# Patient Record
Sex: Female | Born: 1965 | Race: Black or African American | Hispanic: No | Marital: Single | Smoking: Never smoker
Health system: Southern US, Community
[De-identification: ages and names within clinical notes are randomized; demographics above are authoritative.]

## PROBLEM LIST (undated history)

## (undated) DIAGNOSIS — B029 Zoster without complications: Secondary | ICD-10-CM

## (undated) DIAGNOSIS — Z87442 Personal history of urinary calculi: Secondary | ICD-10-CM

## (undated) DIAGNOSIS — I10 Essential (primary) hypertension: Secondary | ICD-10-CM

## (undated) HISTORY — PX: BREAST SURGERY: SHX581

## (undated) HISTORY — PX: ABDOMINAL HYSTERECTOMY: SHX81

## (undated) HISTORY — DX: Personal history of urinary calculi: Z87.442

## (undated) HISTORY — DX: Zoster without complications: B02.9

## (undated) HISTORY — DX: Essential (primary) hypertension: I10

---

## 2011-07-08 ENCOUNTER — Ambulatory Visit (INDEPENDENT_AMBULATORY_CARE_PROVIDER_SITE_OTHER): Payer: BC Managed Care – PPO | Admitting: Internal Medicine

## 2011-07-08 ENCOUNTER — Encounter: Payer: Self-pay | Admitting: Internal Medicine

## 2011-07-08 VITALS — BP 178/117 | HR 66 | Temp 98.2°F | Resp 14 | Ht 63.0 in | Wt 199.2 lb

## 2011-07-08 DIAGNOSIS — Z87442 Personal history of urinary calculi: Secondary | ICD-10-CM | POA: Insufficient documentation

## 2011-07-08 DIAGNOSIS — Z8619 Personal history of other infectious and parasitic diseases: Secondary | ICD-10-CM | POA: Insufficient documentation

## 2011-07-08 DIAGNOSIS — Z139 Encounter for screening, unspecified: Secondary | ICD-10-CM

## 2011-07-08 DIAGNOSIS — I10 Essential (primary) hypertension: Secondary | ICD-10-CM

## 2011-07-08 DIAGNOSIS — E669 Obesity, unspecified: Secondary | ICD-10-CM

## 2011-07-08 MED ORDER — HYDROCHLOROTHIAZIDE 25 MG PO TABS: 25.0000 mg | ORAL_TABLET | Freq: Every day | ORAL | Status: DC

## 2011-07-08 MED ORDER — TRIAMCINOLONE ACETONIDE 0.1 % EX OINT: TOPICAL_OINTMENT | Freq: Two times a day (BID) | CUTANEOUS | Status: AC

## 2011-07-08 NOTE — Patient Instructions (Addendum)
Return for fasting labs next Friday.  Get your blood pressure checked at [facility] in one week.  Goal 130/80 or less.    I called in triamcinolone ointment  for the itching blister,  Use it twice daily for 1 week at a time.  ------------------------------------------------------------------------------------------------------------------------------------------------------------  Consider the Low Glycemic Index Diet and 6 smaller meals daily .  This boosts your metabolism and regulates your sugars:   7 AM Low carbohydrate Protein  Shakes (EAS Carb Control  Or Atkins ,  Available everywhere,   In  cases at BJs )  2.5 carbs  (Add or substitute a toasted sandwhich thin w/ peanut butter)  10 AM: Protein bar by Atkins (snack size,  Chocolate lover's variety at  BJ's)    Lunch: sandwich on pita bread or flatbread (Joseph's makes a pita bread and a flat bread , available at [employer] and BJ's; Toufayah makes a low carb flatbread available at [employer] and HT) Mission makes a low carb whole wheat tortilla available at [employer] most grocery stores   3 PM:  Mid day :  Another protein bar,  Or a  cheese stick, 1/4 cup of almonds, walnuts, pistachios, pecans, peanuts,  Macadamia nuts  6 PM  Dinner:  "mean and green:"  Meat/chicken/fish, salad, and green veggie : use ranch, vinagrette,  Blue cheese, etc  9 PM snack : Breyer's low carb fudgsicle or  ice cream bar (Carb Smart), or  Weight Watcher's ice cream bar , or another protein shake  Make your substitutions less than 15 grams carbs per serving

## 2011-07-08 NOTE — Progress Notes (Signed)
Patient ID: Victoria Hardin, female   DOB: 01/30/1966, 46 y.o.   MRN: [id number]  Patient Active Problem List  Diagnoses  . Shingles outbreak  . History of kidney stones  . Obesity (BMI 30-39.9)  . Hypertension    Subjective:  CC:   Chief Complaint  Patient presents with  . Establish Care    NP to establish; Fasting for labs.  . Hypertension    Pt has been out of BP medication x1 wk.    HPI:   Victoria Carteris a 46 y.o. female who presents as a new patient.  New onset hypertenson noticed by Dr. Jerilee Field during her obgyn exam in February .  Had been having headaches which resolved once she started the hctz.  History of hysterectomy 2011  secondary to menorrhagia and fibroid uterus  Causing anemia  .  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     Past Medical History  Diagnosis Date  . Shingles outbreak   . History of kidney stones     Past Surgical History  Procedure Date  . Abdominal hysterectomy          The following portions of the patient's history were reviewed and updated as appropriate: Allergies, current medications, and problem list.    Review of Systems:   12 Pt  review of systems was negative except those addressed in the HPI,     History   Social History  . Marital Status: Single    Spouse Name: N/A    Number of Children: N/A  . Years of Education: N/A   Occupational History  . Not on file.   Social History Main Topics  . Smoking status: Never Smoker   . Smokeless tobacco: Not on file  . Alcohol Use: 0.6 oz/week    1 Glasses of wine per week  . Drug Use: Not on file  . Sexually Active: Not on file   Other Topics Concern  . Not on file   Social History Narrative  . No narrative on file    Objective:  BP 178/117  Pulse 66  Temp(Src) 98.2 F (36.8 C) (Oral)  Resp 14  Ht 5\' 3"  (1.6 m)  Wt 199 lb 4 oz (90.379 kg)  BMI 35.30 kg/m2  SpO2 99%  General appearance: alert,  cooperative and appears stated age Ears: normal TM's and external ear canals both ears Throat: lips, mucosa, and tongue normal; teeth and gums normal Neck: no adenopathy, no carotid bruit, supple, symmetrical, trachea midline and thyroid not enlarged, symmetric, no tenderness/mass/nodules Back: symmetric, no curvature. ROM normal. No CVA tenderness. Lungs: clear to auscultation bilaterally Heart: regular rate and rhythm, S1, S2 normal, no murmur, click, rub or gallop Abdomen: soft, non-tender; bowel sounds normal; no masses,  no organomegaly Pulses: 2+ and symmetric Skin: Skin color, texture, turgor normal. No rashes or lesions Lymph nodes: Cervical, supraclavicular, and axillary nodes normal.  Assessment and Plan:  Obesity (BMI 30-39.9) I have addressed  BMI and recommended a low glycemic index diet utilizing smaller more frequent meals to increase metabolism.  I have also recommended that patient start exercising with a goal of 30 minutes of aerobic exercise a minimum of 5 days per week. Screening for lipid disorders, thyroid and diabetes to be done today.     Hypertension hctz  dose increasd to 25 mg .  Return for labs in on eweek    Updated Medication List Outpatient Encounter Prescriptions as of  07/08/2011  Medication Sig Dispense Refill  . hydrochlorothiazide (HYDRODIURIL) 25 MG tablet Take 1 tablet (25 mg total) by mouth daily.  30 tablet  6  . ibuprofen (ADVIL,MOTRIN) 200 MG tablet Take 200 mg by mouth as needed.      . triamcinolone ointment (KENALOG) 0.1 % Apply topically 2 (two) times daily.  30 g  1  . Vitamin D, Ergocalciferol, (DRISDOL) 50000 UNITS CAPS Take 50,000 Units by mouth every 7 (seven) days. X12 weeks.      Marland Kitchen DISCONTD: hydrochlorothiazide (MICROZIDE) 12.5 MG capsule Take 12.5 mg by mouth daily.

## 2011-07-11 NOTE — Assessment & Plan Note (Signed)
I have addressed  BMI and recommended a low glycemic index diet utilizing smaller more frequent meals to increase metabolism.  I have also recommended that patient start exercising with a goal of 30 minutes of aerobic exercise a minimum of 5 days per week. Screening for lipid disorders, thyroid and diabetes to be done today.   

## 2011-07-11 NOTE — Assessment & Plan Note (Signed)
hctz  dose increasd to 25 mg .  Return for labs in on eweek

## 2011-07-15 ENCOUNTER — Telehealth: Payer: Self-pay | Admitting: *Deleted

## 2011-07-15 ENCOUNTER — Other Ambulatory Visit (INDEPENDENT_AMBULATORY_CARE_PROVIDER_SITE_OTHER): Payer: BC Managed Care – PPO | Admitting: *Deleted

## 2011-07-15 ENCOUNTER — Telehealth: Payer: Self-pay | Admitting: Internal Medicine

## 2011-07-15 DIAGNOSIS — R5381 Other malaise: Secondary | ICD-10-CM

## 2011-07-15 DIAGNOSIS — R5383 Other fatigue: Secondary | ICD-10-CM

## 2011-07-15 DIAGNOSIS — Z13220 Encounter for screening for lipoid disorders: Secondary | ICD-10-CM

## 2011-07-15 LAB — COMPREHENSIVE METABOLIC PANEL
AST: 13 U/L (ref 0–37)
Albumin: 4 g/dL (ref 3.5–5.2)
CO2: 31 mEq/L (ref 19–32)
Calcium: 9.3 mg/dL (ref 8.4–10.5)
Creatinine, Ser: 0.8 mg/dL (ref 0.4–1.2)
GFR: 94.76 mL/min (ref >60.00)
Glucose, Bld: 81 mg/dL (ref 70–99)
Potassium: 3.4 mEq/L — ABNORMAL LOW (ref 3.5–5.1)
Sodium: 138 mEq/L (ref 135–145)
Total Protein: 7.6 g/dL (ref 6.0–8.3)

## 2011-07-15 LAB — CBC WITH DIFFERENTIAL/PLATELET
Basophils Relative: 0.6 % (ref 0.0–3.0)
Eosinophils Absolute: 0.1 10*3/uL (ref 0.0–0.7)
Eosinophils Relative: 1.3 % (ref 0.0–5.0)
HCT: 43.1 % (ref 36.0–46.0)
Lymphs Abs: 1.5 10*3/uL (ref 0.7–4.0)
MCHC: 32.4 g/dL (ref 30.0–36.0)
MCV: 86.1 fl (ref 78.0–100.0)
Monocytes Absolute: 0.4 10*3/uL (ref 0.1–1.0)
Neutro Abs: 2.6 10*3/uL (ref 1.4–7.7)
Neutrophils Relative %: 57.2 % (ref 43.0–77.0)
RBC: 5.01 Mil/uL (ref 3.87–5.11)
WBC: 4.6 10*3/uL (ref 4.5–10.5)

## 2011-07-15 LAB — LIPID PANEL
Cholesterol: 200 mg/dL (ref 0–200)
HDL: 57.6 mg/dL (ref >39.00)
Triglycerides: 106 mg/dL (ref 0.0–149.0)

## 2011-07-15 MED ORDER — POTASSIUM CHLORIDE CRYS ER 10 MEQ PO TBCR: 20.0000 meq | EXTENDED_RELEASE_TABLET | Freq: Every day | ORAL | Status: DC

## 2011-07-15 NOTE — Progress Notes (Signed)
Addended by: Duncan Dull on: 07/15/2011 05:40 PM   Modules accepted: Orders

## 2011-07-15 NOTE — Telephone Encounter (Signed)
See result note ,  misrouted when i sent potassium supplement to pharmacy

## 2011-07-15 NOTE — Telephone Encounter (Signed)
Labs ordered.

## 2011-07-15 NOTE — Telephone Encounter (Signed)
Patient came in for labs today. What would you like ordered?

## 2011-07-15 NOTE — Telephone Encounter (Signed)
Cmet, fasting lipids, TSH, and CBC  :  V58.59,  V77.91 fatigue 780.79

## 2011-09-22 ENCOUNTER — Encounter: Payer: Self-pay | Admitting: Internal Medicine

## 2012-03-31 ENCOUNTER — Other Ambulatory Visit: Payer: Self-pay

## 2012-12-20 ENCOUNTER — Other Ambulatory Visit: Payer: Self-pay

## 2013-01-14 ENCOUNTER — Ambulatory Visit (INDEPENDENT_AMBULATORY_CARE_PROVIDER_SITE_OTHER): Payer: BC Managed Care – PPO | Admitting: Internal Medicine

## 2013-01-14 ENCOUNTER — Encounter: Payer: Self-pay | Admitting: Internal Medicine

## 2013-01-14 VITALS — BP 190/110 | HR 62 | Temp 98.2°F | Resp 14 | Ht 63.0 in | Wt 199.5 lb

## 2013-01-14 DIAGNOSIS — R5381 Other malaise: Secondary | ICD-10-CM

## 2013-01-14 DIAGNOSIS — Z23 Encounter for immunization: Secondary | ICD-10-CM

## 2013-01-14 DIAGNOSIS — E669 Obesity, unspecified: Secondary | ICD-10-CM

## 2013-01-14 DIAGNOSIS — E785 Hyperlipidemia, unspecified: Secondary | ICD-10-CM

## 2013-01-14 DIAGNOSIS — I10 Essential (primary) hypertension: Secondary | ICD-10-CM

## 2013-01-14 MED ORDER — AMLODIPINE BESYLATE 10 MG PO TABS: 10.0000 mg | ORAL_TABLET | Freq: Every day | ORAL | Status: DC

## 2013-01-14 NOTE — Progress Notes (Signed)
Patient ID: Victoria Hardin, female   DOB: 11/10/1965, 47 y.o.   MRN: [id number]   Patient Active Problem List   Diagnosis Date Noted  . Obesity (BMI 30-39.9) 07/08/2011  . Hypertension 07/08/2011  . Shingles outbreak   . History of kidney stones     Subjective:  CC:   Chief Complaint  Patient presents with  . Acute Visit    BP Elevated    HPI:   Victoria Carteris a 47 y.o. female who presents for fllow up on hypertension ,. Was seen initially May 2013,  Did not follow up. Has not been taking any anti hypertensive medication for nearly 6 months.  Denies headaches, chest pain ,lower extremity edema and exertional chest pain.    Past Medical History  Diagnosis Date  . Shingles outbreak   . History of kidney stones     Past Surgical History  Procedure Laterality Date  . Abdominal hysterectomy         The following portions of the patient's history were reviewed and updated as appropriate: Allergies, current medications, and problem list.    Review of Systems:   12 Pt  review of systems was negative except those addressed in the HPI,     History   Social History  . Marital Status: Single    Spouse Name: N/A    Number of Children: N/A  . Years of Education: N/A   Occupational History  . Not on file.   Social History Main Topics  . Smoking status: Never Smoker   . Smokeless tobacco: Not on file  . Alcohol Use: 0.6 oz/week    1 Glasses of wine per week  . Drug Use: Not on file  . Sexual Activity: Not on file   Other Topics Concern  . Not on file   Social History Narrative  . No narrative on file    Objective:  Filed Vitals:   01/14/13 1604  BP: 190/110  Pulse:   Temp:   Resp:      General appearance: alert, cooperative and appears stated age Ears: normal TM's and external ear canals both ears Throat: lips, mucosa, and tongue normal; teeth and gums normal Neck: no adenopathy, no carotid bruit, supple, symmetrical, trachea midline and  thyroid not enlarged, symmetric, no tenderness/mass/nodules Back: symmetric, no curvature. ROM normal. No CVA tenderness. Lungs: clear to auscultation bilaterally Heart: regular rate and rhythm, S1, S2 normal, no murmur, click, rub or gallop Abdomen: soft, non-tender; bowel sounds normal; no masses,  no organomegaly Pulses: 2+ and symmetric Skin: Skin color, texture, turgor normal. No rashes or lesions Lymph nodes: Cervical, supraclavicular, and axillary nodes normal.  Assessment and Plan:  Obesity (BMI 30-39.9) I have addressed  BMI and recommended wt loss of 10% of body weight over the next 6 months using a low glycemic index diet and regular exercise a minimum of 5 days per week.    Hypertension Elevated today due to medication lapse. Given prior hypokalemia with use of diuretic,  chanding medication to amlodipine 5 mg daily.  Will change to ARB if she has proteinuria   A total of 25 minutes of face to face time was spent with patient more than half of which was spent in counselling and coordination of care  Updated Medication List Outpatient Encounter Prescriptions as of 01/14/2013  Medication Sig  . ibuprofen (ADVIL,MOTRIN) 200 MG tablet Take 200 mg by mouth as needed.  . Multiple Vitamins-Minerals (MULTIVITAMIN WITH MINERALS) tablet Take 1 tablet by mouth  daily.  . amLODipine (NORVASC) 10 MG tablet Take 1 tablet (10 mg total) by mouth daily.  . [DISCONTINUED] potassium chloride (K-DUR,KLOR-CON) 10 MEQ tablet Take 2 tablets (20 mEq total) by mouth daily.  . [DISCONTINUED] Vitamin D, Ergocalciferol, (DRISDOL) 50000 UNITS CAPS Take 50,000 Units by mouth every 7 (seven) days. X12 weeks.     Orders Placed This Encounter  Procedures  . Tdap vaccine greater than or equal to 7yo IM  . CBC with Differential  . Comprehensive metabolic panel  . TSH  . LDL cholesterol, direct  . Microalbumin / creatinine urine ratio    No Follow-up on file.

## 2013-01-14 NOTE — Patient Instructions (Addendum)
Your blood pressure is very high,  I am resuming medication with amlodipine.  But if your urien test is positive for protein I will be changing your medication to a different type.   Start with 1/2 tablet amlodipine  Daily .  If bp is still > 130/80 after one week,  Increase your dose  to  one   full tablet daily.  If you develop edema ( ankle swelling)  ,  We can prescribe a fluid pill to take as needed.   Baseline labs today ,. fasting labs in 6 months    This is  One version of a  "Low GI"  Diet:  It will still lower your blood sugars and allow you to lose 4 to 8  lbs  per month if you follow it carefully.  Your goal with exercise is a minimum of 30 minutes of aerobic exercise 5 days per week (Walking does not count once it becomes easy!)    All of the foods can be found at grocery stores and in bulk at [employer].  The Atkins protein bars and shakes are available in more varieties at Target, WalMart and [employer] Foods.     7 AM Breakfast:  Choose from the following:  Low carbohydrate Protein  Shakes (I recommend the EAS AdvantEdge "Carb Control" shakes  Or the low carb shakes by Atkins.    2.5 carbs   Arnold's "Sandwhich Thin"toasted  w/ peanut butter (no jelly: about 20 net carbs  "Bagel Thin" with cream cheese and salmon: about 20 carbs   a scrambled egg/bacon/cheese burrito made with Mission's "carb balance" whole wheat tortilla  (about 10 net carbs )   Avoid cereal and bananas, oatmeal and cream of wheat and grits. They are loaded with carbohydrates!   10 AM: high protein snack  Protein bar by Atkins (the snack size, under 200 cal, usually < 6 net carbs).    A stick of cheese:  Around 1 carb,  100 cal     Dannon Light n Fit [place] Yogurt  (80 cal, 8 carbs)  Other so called "protein bars" and Greek yogurts tend to be loaded with carbohydrates.  Remember, in food advertising, the word "energy" is synonymous for " carbohydrate."  Lunch:   A Sandwich using the bread choices listed, Can  use any  Eggs,  lunchmeat, grilled meat or canned tuna), avocado, regular mayo/mustard  and cheese.  A Salad using blue cheese, ranch,  Goddess or vinagrette,  No croutons or "confetti" and no "candied nuts" but regular nuts OK.   No pretzels or chips.  Try the miniature sweet peppers ; they are a good low carb alternative that provide a "crunch" ([employer])  The bread is the only source of carbohydrate in a sandwich and  can be decreased by trying some of these alternatives to traditional loaf bread  Joseph's makes a pita bread and a flat bread that are 50 cal and 4 net carbs available at BJs and WalMart.  This can be toasted to use with hummous as well  Toufayan makes a low carb flatbread that's 100 cal and 9 net carbs available at [employer] and [employer] makes 2 sizes of  Low carb whole wheat tortilla  (The large one is 210 cal and 6 net carbs) Avoid "Low fat dressings, as well as Reyne Dumas and [address] dressings They are loaded with sugar!   3 PM/ Mid day  Snack:  Consider  1 ounce  of  almonds, walnuts, pistachios, pecans, peanuts,  Macadamia nuts or a nut medley.  Avoid "granola"; the dried cranberries and raisins are loaded with carbohydrates. Mixed nuts as long as there are no raisins,  cranberries or dried fruit.     6 PM  Dinner:     Meat/fowl/fish with a green salad, and either broccoli, cauliflower, green beans, spinach, brussel sprouts or  Lima beans. DO NOT BREAD THE PROTEIN!!      There is a low carb pasta by Dreamfield's that is acceptable and tastes great: ( All grocery stores but BJs carry it )  Try Kai Levins Angelo's chicken piccata or chicken or eggplant parm over low carb pasta.(Lowes and BJs)   Clifton Custard Sanchez's "Carnitas" (pulled pork, no sauce,  0 carbs) or his beef pot roast to make a dinner burrito (at BJ's)  Pesto over low carb pasta (bj's sells a good quality pesto in the center refrigerated section of the deli   Whole wheat pasta is still full of digestible  carbs and  Not as low in glycemic index as Dreamfield's.   Brown rice is still rice,  So skip the rice and noodles if you eat [place] or [place] (or at least limit to 1/2 cup)  9 PM snack :   Breyer's "low carb" fudgsicle or  ice cream bar (Carb Smart line), or  Weight Watcher's ice cream bar , or another "no sugar added" ice cream;  a serving of fresh berries/cherries with whipped cream   Cheese or DANNON'S LlGHT [place] FIT [place] YOGURT  Avoid bananas, pineapple, grapes  and watermelon on a regular basis because they are high in sugar.  THINK OF THEM AS DESSERT  Remember that snack Substitutions should be less than 10 NET carbs per serving and meals < 20 carbs. Remember to subtract fiber grams to get the "net carbs."  Hypertension As your heart beats, it forces blood through your arteries. This force is your blood pressure. If the pressure is too high, it is called hypertension (HTN) or high blood pressure. HTN is dangerous because you may have it and not know it. High blood pressure may mean that your heart has to work harder to pump blood. Your arteries may be narrow or stiff. The extra work puts you at risk for heart disease, stroke, and other problems.  Blood pressure consists of two numbers, a higher number over a lower, 110/72, for example. It is stated as "110 over 72." The ideal is below 120 for the top number (systolic) and under 80 for the bottom (diastolic). Write down your blood pressure today. You should pay close attention to your blood pressure if you have certain conditions such as:  Heart failure.  Prior heart attack.  Diabetes  Chronic kidney disease.  Prior stroke.  Multiple risk factors for heart disease. To see if you have HTN, your blood pressure should be measured while you are seated with your arm held at the level of the heart. It should be measured at least twice. A one-time elevated blood pressure reading (especially in the Emergency Department) does not mean that you  need treatment. There may be conditions in which the blood pressure is different between your right and left arms. It is important to see your caregiver soon for a recheck. Most people have essential hypertension which means that there is not a specific cause. This type of high blood pressure may be lowered by changing lifestyle factors such as:  Stress.  Smoking.  Lack  of exercise.  Excessive weight.  Drug/tobacco/alcohol use.  Eating less salt. Most people do not have symptoms from high blood pressure until it has caused damage to the body. Effective treatment can often prevent, delay or reduce that damage. TREATMENT  When a cause has been identified, treatment for high blood pressure is directed at the cause. There are a large number of medications to treat HTN. These fall into several categories, and your caregiver will help you select the medicines that are best for you. Medications may have side effects. You should review side effects with your caregiver. If your blood pressure stays high after you have made lifestyle changes or started on medicines,   Your medication(s) may need to be changed.  Other problems may need to be addressed.  Be certain you understand your prescriptions, and know how and when to take your medicine.  Be sure to follow up with your caregiver within the time frame advised (usually within two weeks) to have your blood pressure rechecked and to review your medications.  If you are taking more than one medicine to lower your blood pressure, make sure you know how and at what times they should be taken. Taking two medicines at the same time can result in blood pressure that is too low. SEEK IMMEDIATE MEDICAL CARE IF:  You develop a severe headache, blurred or changing vision, or confusion.  You have unusual weakness or numbness, or a faint feeling.  You have severe chest or abdominal pain, vomiting, or breathing problems. MAKE SURE YOU:   Understand  these instructions.  Will watch your condition.  Will get help right away if you are not doing well or get worse. Document Released: 01/31/2005 Document Revised: 04/25/2011 Document Reviewed: 09/21/2007 [facility] Patient Information 2014 [place], [place].

## 2013-01-14 NOTE — Assessment & Plan Note (Addendum)
Elevated today due to medication lapse. Given prior hypokalemia with use of diuretic,  chanding medication to amlodipine 5 mg daily.  Will change to ARB if she has proteinuria

## 2013-01-14 NOTE — Assessment & Plan Note (Signed)
I have addressed  BMI and recommended wt loss of 10% of body weight over the next 6 months using a low glycemic index diet and regular exercise a minimum of 5 days per week.   

## 2013-01-14 NOTE — Progress Notes (Signed)
Pre-visit discussion using our clinic review tool. No additional management support is needed unless otherwise documented below in the visit note.  

## 2013-01-15 LAB — COMPREHENSIVE METABOLIC PANEL
ALT: 10 U/L (ref 0–35)
AST: 15 U/L (ref 0–37)
Albumin: 4 g/dL (ref 3.5–5.2)
Alkaline Phosphatase: 76 U/L (ref 39–117)
BUN: 13 mg/dL (ref 6–23)
Calcium: 9.6 mg/dL (ref 8.4–10.5)
Creatinine, Ser: 0.9 mg/dL (ref 0.4–1.2)
Glucose, Bld: 83 mg/dL (ref 70–99)
Potassium: 4.3 mEq/L (ref 3.5–5.1)
Total Protein: 7.8 g/dL (ref 6.0–8.3)

## 2013-01-15 LAB — CBC WITH DIFFERENTIAL/PLATELET
Basophils Relative: 0 % (ref 0.0–3.0)
Eosinophils Absolute: 0.1 10*3/uL (ref 0.0–0.7)
Eosinophils Relative: 2 % (ref 0.0–5.0)
HCT: 42.3 % (ref 36.0–46.0)
Hemoglobin: 13.8 g/dL (ref 12.0–15.0)
MCV: 84.9 fl (ref 78.0–100.0)
Monocytes Relative: 6.8 % (ref 3.0–12.0)
Neutro Abs: 3.4 10*3/uL (ref 1.4–7.7)
Neutrophils Relative %: 63.2 % (ref 43.0–77.0)
Platelets: 189 10*3/uL (ref 150.0–400.0)
RBC: 4.98 Mil/uL (ref 3.87–5.11)
RDW: 14.6 % (ref 11.5–14.6)
WBC: 5.4 10*3/uL (ref 4.5–10.5)

## 2013-01-15 LAB — TSH: TSH: 1.18 u[IU]/mL (ref 0.35–5.50)

## 2013-01-15 LAB — LDL CHOLESTEROL, DIRECT: Direct LDL: 160 mg/dL

## 2013-01-16 ENCOUNTER — Encounter: Payer: Self-pay | Admitting: Internal Medicine

## 2013-01-16 NOTE — Addendum Note (Signed)
Addended by: Sherlene Shams on: 01/16/2013 06:04 PM   Modules accepted: Orders

## 2013-01-25 ENCOUNTER — Ambulatory Visit: Payer: Self-pay | Admitting: Internal Medicine

## 2013-02-01 ENCOUNTER — Other Ambulatory Visit (INDEPENDENT_AMBULATORY_CARE_PROVIDER_SITE_OTHER): Payer: BC Managed Care – PPO

## 2013-02-01 DIAGNOSIS — I10 Essential (primary) hypertension: Secondary | ICD-10-CM

## 2013-02-01 DIAGNOSIS — E669 Obesity, unspecified: Secondary | ICD-10-CM

## 2013-02-01 DIAGNOSIS — E785 Hyperlipidemia, unspecified: Secondary | ICD-10-CM

## 2013-02-01 DIAGNOSIS — Z79899 Other long term (current) drug therapy: Secondary | ICD-10-CM

## 2013-02-01 LAB — LIPID PANEL
Cholesterol: 197 mg/dL (ref 0–200)
HDL: 53.1 mg/dL (ref >39.00)
LDL Cholesterol: 130 mg/dL — ABNORMAL HIGH (ref 0–99)
Total CHOL/HDL Ratio: 4
Triglycerides: 68 mg/dL (ref 0.0–149.0)

## 2013-02-04 ENCOUNTER — Encounter: Payer: Self-pay | Admitting: Internal Medicine

## 2013-02-04 DIAGNOSIS — E785 Hyperlipidemia, unspecified: Secondary | ICD-10-CM | POA: Insufficient documentation

## 2013-02-04 MED ORDER — ATORVASTATIN CALCIUM 20 MG PO TABS: 20.0000 mg | ORAL_TABLET | Freq: Every day | ORAL | Status: DC

## 2013-02-04 NOTE — Addendum Note (Signed)
Addended by: Sherlene Shams on: 02/04/2013 10:23 AM   Modules accepted: Orders

## 2013-02-12 ENCOUNTER — Encounter: Payer: Self-pay | Admitting: Internal Medicine

## 2013-02-27 NOTE — Telephone Encounter (Signed)
Pt do not read message. I left a detailed message on her voicemail reading what the mychart message from Dr. Darrick Huntsmanullo stated.

## 2013-03-15 ENCOUNTER — Ambulatory Visit: Payer: Self-pay | Admitting: Internal Medicine

## 2013-03-19 ENCOUNTER — Telehealth: Payer: Self-pay | Admitting: Internal Medicine

## 2013-03-19 ENCOUNTER — Ambulatory Visit: Payer: BC Managed Care – PPO | Admitting: Internal Medicine

## 2013-03-19 NOTE — Telephone Encounter (Signed)
Patient Information:  Caller Name: Marti SleighFelecia  Phone: [phone]  Patient: Victoria Hardin, Victoria Hardin  Gender: Female  DOB: [birth year]  Age: 48 Years  PCP: Duncan Dullullo, Teresa (Adults only)  Pregnant: No  Office Follow Up:  Does the office need to follow up with this patient?: No  Instructions For The Office: N/A  RN Note:  Pt agrees to OV and call back information.  Warning given that if she should have pain in chest or other cardiac sxs she should be seen in ED  Symptoms  Reason For Call & Symptoms: Pt reports that on Left side that started in back and has moved to side and left hand.  Feels like Hardin numbness.  Pain with movement.  Pt reports that she can move hand like normal it is painful when she does it.  Pt has taken Advil.  Worst when she first gets up and use hand.  Pt states that pain is worse with use.  Reviewed Health History In EMR: Yes  Reviewed Medications In EMR: Yes  Reviewed Allergies In EMR: Yes  Reviewed Surgeries / Procedures: Yes  Date of Onset of Symptoms: 03/12/2013 OB / GYN:  LMP: Unknown  Guideline(s) Used:  Arm Pain  Disposition Per Guideline:   See Within 3 Days in Office  Reason For Disposition Reached:   Moderate pain (e.g. interferes with normal activities) and present > 3 days  Advice Given:  Reassurance - Muscle Strain  Definition: Hardin muscle strain occurs from over-stretching or tearing Hardin muscle. People often call this Hardin "pulled muscle". This muscle injury can occur while exercising, while lifting something, or sometimes during normal activities. -  Apply Cold to the Area for First 48 Hours  Apply Hardin cold pack or an ice bag (wrapped in Hardin moist towel) to the area for 20 minutes. Repeat in 1 hour, then every 4 hours while awake.  Apply Heat to the Area:  Beginning 48 hours after an injury, apply Hardin warm washcloth or heating pad for 10 minutes 3 times Hardin day.  Call Back If:  You become worse.  Patient Will Follow Care Advice:  YES  Appointment Scheduled:  03/20/2013 09:15:00 Appointment Scheduled Provider:  Dale DurhamScott, Charlene

## 2013-03-19 NOTE — Telephone Encounter (Signed)
FYI

## 2013-03-20 ENCOUNTER — Encounter: Payer: Self-pay | Admitting: Internal Medicine

## 2013-03-20 ENCOUNTER — Ambulatory Visit (INDEPENDENT_AMBULATORY_CARE_PROVIDER_SITE_OTHER): Payer: BC Managed Care – PPO | Admitting: Internal Medicine

## 2013-03-20 VITALS — BP 122/90 | HR 73 | Temp 98.6°F | Ht 63.0 in | Wt 199.0 lb

## 2013-03-20 DIAGNOSIS — I10 Essential (primary) hypertension: Secondary | ICD-10-CM

## 2013-03-20 DIAGNOSIS — M79609 Pain in unspecified limb: Secondary | ICD-10-CM

## 2013-03-20 DIAGNOSIS — M79602 Pain in left arm: Secondary | ICD-10-CM

## 2013-03-20 NOTE — Progress Notes (Signed)
Pre-visit discussion using our clinic review tool. No additional management support is needed unless otherwise documented below in the visit note.  

## 2013-03-25 ENCOUNTER — Encounter: Payer: Self-pay | Admitting: Internal Medicine

## 2013-03-25 DIAGNOSIS — G5603 Carpal tunnel syndrome, bilateral upper limbs: Secondary | ICD-10-CM | POA: Insufficient documentation

## 2013-03-25 DIAGNOSIS — G5602 Carpal tunnel syndrome, left upper limb: Secondary | ICD-10-CM | POA: Insufficient documentation

## 2013-03-25 NOTE — Assessment & Plan Note (Addendum)
Blood pressure as outlined.  Follow.  Has a scheduled follow up with Dr Darrick Huntsmanullo next week.

## 2013-03-25 NOTE — Assessment & Plan Note (Signed)
Describes the left arm pain and hand pain and numbness as outlined.  Good rom in her neck.  No pain in her neck or shoulder with movement.  Her symptoms and exam appear to be more consistent with probable carpal tunnel syndrome.  Will have her wear cock up wrist splints.  Gentle use of ibuprofen/tylenol as directed.  Keep f/u next week with Dr Darrick Huntsmanullo.  Further w/up pending the response to above conservative measures.

## 2013-03-25 NOTE — Progress Notes (Signed)
   Subjective:    Patient ID: Nicky PughFelecia Dowdy, female    DOB: [birth year], 48 y.o.   MRN: [id number]  HPI 48 year old female with past history of hypertension and hypercholesterolemia who comes in today as a work in with concerns regarding some left had discomfort and numbness.  On questioning her she states symptoms worsened approximately one week ago.  She works on a [employer] and does and a lot of typing.  Has noticed some numbness in her left hand and her right thumb fingertip.  Symptoms are worse in the am.  Once up and moving, better.  Some discomfort extends up her left arm.  No significant pain in her shoulder.  Some neck discomfort at times.  No known injury or trauma.  No weakness.     Past Medical History  Diagnosis Date  . Shingles outbreak   . History of kidney stones     Current Outpatient Prescriptions on File Prior to Visit  Medication Sig Dispense Refill  . amLODipine (NORVASC) 10 MG tablet Take 1 tablet (10 mg total) by mouth daily.  30 tablet  11  . atorvastatin (LIPITOR) 20 MG tablet Take 1 tablet (20 mg total) by mouth daily.  90 tablet  3  . ibuprofen (ADVIL,MOTRIN) 200 MG tablet Take 200 mg by mouth as needed.      . Multiple Vitamins-Minerals (MULTIVITAMIN WITH MINERALS) tablet Take 1 tablet by mouth daily.       No current facility-administered medications on file prior to visit.    Review of Systems Patient denies any headache, lightheadedness or dizziness.  No chest pain, tightness or palpatations.  No increased shortness of breath.  No nausea or vomiting.  Left arm/hand discomfort and numbness.  No significant pain in the shoulder.  No injury or trauma.   Took ibuprofen yesterday.  Is some better today.       Objective:   Physical Exam Filed Vitals:   03/20/13 0953  BP: 122/90  Pulse: 73  Temp: 98.6 F (37 C)   Blood pressure recheck:  6132/3286  48 year old female in no acute distress.  NECK:  Supple.  Nontender.  Good rom.  No pain with rotation of her  head.  HEART:  Appears to be regular. LUNGS:  No crackles or wheezing audible.  Respirations even and unlabored.  RADIAL PULSE:  Equal bilaterally.    MSK:  No pain in the shoulder with full extension.  Good rom in her shoulder. Grip strength normal.  Negative phalens and tinels.  No pain with rotation of her forearm.           Assessment & Plan:

## 2013-03-26 ENCOUNTER — Ambulatory Visit (INDEPENDENT_AMBULATORY_CARE_PROVIDER_SITE_OTHER): Payer: BC Managed Care – PPO | Admitting: Internal Medicine

## 2013-03-26 ENCOUNTER — Encounter: Payer: Self-pay | Admitting: Internal Medicine

## 2013-03-26 VITALS — BP 142/88 | HR 84 | Temp 98.4°F | Resp 18 | Wt 200.0 lb

## 2013-03-26 DIAGNOSIS — I10 Essential (primary) hypertension: Secondary | ICD-10-CM

## 2013-03-26 DIAGNOSIS — G5602 Carpal tunnel syndrome, left upper limb: Secondary | ICD-10-CM

## 2013-03-26 DIAGNOSIS — E669 Obesity, unspecified: Secondary | ICD-10-CM

## 2013-03-26 DIAGNOSIS — Z79899 Other long term (current) drug therapy: Secondary | ICD-10-CM

## 2013-03-26 DIAGNOSIS — G5600 Carpal tunnel syndrome, unspecified upper limb: Secondary | ICD-10-CM

## 2013-03-26 MED ORDER — FUROSEMIDE 20 MG PO TABS: 20.0000 mg | ORAL_TABLET | Freq: Every day | ORAL | Status: DC

## 2013-03-26 MED ORDER — MELOXICAM 15 MG PO TABS: 15.0000 mg | ORAL_TABLET | Freq: Every day | ORAL | Status: DC

## 2013-03-26 NOTE — Patient Instructions (Addendum)
For your wrist pain  we are replacing  Your ibuprofen with once daily meloxicam for 24 hour control  of inflammation.  This is an NSAID  You can add up to 2000 mg tylenol daily in divided doses to the meloxicam if needed for addiotnal control of pain .  This is an analgesic   If this combination does not control pain,  We should do further evaluation with nerve conduction studies  continue to use the splints at night to allow your wirst to rest   Try to keep the wrist in neutral position when typing  Ask your brother if your snoring is so bad it causes you to stop breathing at times .  This can be a sign of sleep apnea .  Your blood pressure is improved with amlodipine You can take furosemide once daily if needed for fluid retention  I want you to try to lose 20 lbs over the next 6 months through diet and exercise.  The MediFast diet is very effective,  But you can also lose weight with a low glycemic index diet.   This is  my version of a  "Low GI"  Diet:  It will still lower your blood sugars and allow you to lose 4 to 8  lbs  per month if you follow it carefully.  Your goal with exercise is a minimum of 30 minutes of aerobic exercise 5 days per week (Walking does not count once it becomes easy!)    All of the foods can be found at grocery stores and in bulk at [employer].  The Atkins protein bars and shakes are available in more varieties at Target, WalMart and [employer]'s Foods.     7 AM Breakfast:  Choose from the following:  Low carbohydrate Protein  Shakes (I recommend the EAS AdvantEdge "Carb Control" shakes  Or the low carb shakes by Atkins.    2.5 carbs   Arnold's "Sandwhich Thin"toasted  w/ peanut butter (no jelly: about 20 net carbs  "Bagel Thin" with cream cheese and salmon: about 20 carbs   a scrambled egg/bacon/cheese burrito made with Mission's "carb balance" whole wheat tortilla  (about 10 net carbs )   Avoid cereal and bananas, oatmeal and cream of wheat and grits. They are  loaded with carbohydrates!   10 AM: high protein snack  Protein bar by Atkins (the snack size, under 200 cal, usually < 6 net carbs).    A stick of cheese:  Around 1 carb,  100 cal     Dannon Light n Fit [place] Yogurt  (80 cal, 8 carbs)  Other so called "protein bars" and Greek yogurts tend to be loaded with carbohydrates.  Remember, in food advertising, the word "energy" is synonymous for " carbohydrate."  Lunch:   A Sandwich using the bread choices listed, Can use any  Eggs,  lunchmeat, grilled meat or canned tuna), avocado, regular mayo/mustard  and cheese.  A Salad using blue cheese, ranch,  Goddess or vinagrette,  No croutons or "confetti" and no "candied nuts" but regular nuts OK.   No pretzels or chips.  Pickles and miniature sweet peppers are a good low carb alternative that provide a "crunch"  The bread is the only source of carbohydrate in a sandwich and  can be decreased by trying some of these alternatives to traditional loaf bread  Joseph's makes a pita bread and a flat bread that are 50 cal and 4 net carbs available at BJs and  WalMart.  This can be toasted to use with hummous as well  Toufayan makes a low carb flatbread that's 100 cal and 9 net carbs available at [employer] and [employer] makes 2 sizes of  Low carb whole wheat tortilla  (The large one is 210 cal and 6 net carbs) Avoid "Low fat dressings, as well as Reyne Dumas and [address] dressings They are loaded with sugar!   3 PM/ Mid day  Snack:  Consider  1 ounce of  almonds, walnuts, pistachios, pecans, peanuts,  Macadamia nuts or a nut medley.  Avoid "granola"; the dried cranberries and raisins are loaded with carbohydrates. Mixed nuts as long as there are no raisins,  cranberries or dried fruit.     6 PM  Dinner:     Meat/fowl/fish with a green salad, and either broccoli, cauliflower, green beans, spinach, brussel sprouts or  Lima beans. DO NOT BREAD THE PROTEIN!!      There is a low carb pasta by Dreamfield's  that is acceptable and tastes great: only 5 digestible carbs/serving.( All grocery stores but BJs carry it )  Try Kai Levins Angelo's chicken piccata or chicken or eggplant parm over low carb pasta.(Lowes and BJs)   Clifton Custard Sanchez's "Carnitas" (pulled pork, no sauce,  0 carbs) or his beef pot roast to make a dinner burrito (at [employer]J's)  Pesto over low carb pasta (bj's sells a good quality pesto in the center refrigerated section of the deli   Whole wheat pasta is still full of digestible carbs and  Not as low in glycemic index as Dreamfield's.   Brown rice is still rice,  So skip the rice and noodles if you eat [place] or [place] (or at least limit to 1/2 cup)  9 PM snack :   Breyer's "low carb" fudgsicle or  ice cream bar (Carb Smart line), or  Weight Watcher's ice cream bar , or another "no sugar added" ice cream;  a serving of fresh berries/cherries with whipped cream   Cheese or DANNON'S LlGHT N FIT GREEK YOGURT  Avoid bananas, pineapple, grapes  and watermelon on a regular basis because they are high in sugar.  THINK OF THEM AS DESSERT  Remember that snack Substitutions should be less than 10 NET carbs per serving and meals < 20 carbs. Remember to subtract fiber grams to get the "net carbs."

## 2013-03-26 NOTE — Progress Notes (Signed)
Pre-visit discussion using our clinic review tool. No additional management support is needed unless otherwise documented below in the visit note.  

## 2013-03-27 ENCOUNTER — Encounter: Payer: Self-pay | Admitting: Internal Medicine

## 2013-03-27 ENCOUNTER — Telehealth: Payer: Self-pay | Admitting: Internal Medicine

## 2013-03-27 LAB — COMPREHENSIVE METABOLIC PANEL
ALBUMIN: 3.9 g/dL (ref 3.5–5.2)
ALT: 15 U/L (ref 0–35)
AST: 23 U/L (ref 0–37)
Alkaline Phosphatase: 89 U/L (ref 39–117)
BUN: 15 mg/dL (ref 6–23)
CO2: 27 mEq/L (ref 19–32)
Calcium: 9.2 mg/dL (ref 8.4–10.5)
Chloride: 107 mEq/L (ref 96–112)
Creatinine, Ser: 0.9 mg/dL (ref 0.4–1.2)
GFR: 83.54 mL/min (ref >60.00)
GLUCOSE: 83 mg/dL (ref 70–99)
Potassium: 3.8 mEq/L (ref 3.5–5.1)
SODIUM: 140 meq/L (ref 135–145)
Total Bilirubin: 0.5 mg/dL (ref 0.3–1.2)
Total Protein: 7.5 g/dL (ref 6.0–8.3)

## 2013-03-27 LAB — MICROALBUMIN / CREATININE URINE RATIO
Creatinine,U: 139.2 mg/dL
MICROALB UR: 1.3 mg/dL (ref 0.0–1.9)
Microalb Creat Ratio: 0.9 mg/g (ref 0.0–30.0)

## 2013-03-27 NOTE — Assessment & Plan Note (Signed)
With snoring and hypertension.  Discussed risk for OSA.  Asked patient to talk with brother about breathign pattern  No daytime hypersomnolence. I have addressed  BMI and recommended a low glycemic index diet utilizing smaller more frequent meals to increase metabolism.  I have also recommended that patient start exercising with a goal of 30 minutes of aerobic exercise a minimum of 5 days per week. Screening for lipid disorders, thyroid and diabetes to be done today.

## 2013-03-27 NOTE — Telephone Encounter (Signed)
Relevant patient education assigned to patient using Emmi. ° °

## 2013-03-27 NOTE — Progress Notes (Signed)
Patient ID: Victoria Hardin, female   DOB: 02/18/1965, 48 y.o.   MRN: [id number]  Patient Active Problem List   Diagnosis Date Noted  . Carpal tunnel syndrome of left wrist 03/25/2013  . Other and unspecified hyperlipidemia 02/04/2013  . Obesity (BMI 30-39.9) 07/08/2011  . Hypertension 07/08/2011  . Shingles outbreak   . History of kidney stones     Subjective:  CC:   Chief Complaint  Patient presents with  . Follow-up    from Dr.scott for left arm pain.    HPI:   Victoria PughFelecia Wadding is a 48 y.o. female who presents for  follow up on multiple issues  Wrist/hand pain ,  She has been having left hand pain that radiates up to elbow for several months. Treated by Dr Lorin PicketScott  For signs and  sx consistent with CTS with splint several weeks ago.  Pain improved but not resolved.  Spends 8 hrs daily at keyboard,  Does not use the wrist bar for support.   using OTC ibuprifen once daily with transietn pain relief.   HTN:  Her BP has Improved since last visit on amlodipine and she is tolerating medication but has noticed occasional fluid retention in LE's and occasionally in hands.  Diet discussed,  Relation to high sodium foods discussed.   Obesity.  No weight loss yet.  Not exercising but making some changes to diet.  Brother has noticed snoring is worse. No daytime hypersomolence,  No prior sleep study    Past Medical History  Diagnosis Date  . Shingles outbreak   . History of kidney stones     Past Surgical History  Procedure Laterality Date  . Abdominal hysterectomy         The following portions of the patient's history were reviewed and updated as appropriate: Allergies, current medications, and problem list.    Review of Systems:   Patient denies headache, fevers, malaise, unintentional weight loss, skin rash, eye pain, sinus congestion and sinus pain, sore throat, dysphagia,  hemoptysis , cough, dyspnea, wheezing, chest pain, palpitations, orthopnea, edema, abdominal pain,  nausea, melena, diarrhea, constipation, flank pain, dysuria, hematuria, urinary  Frequency, nocturia, numbness, tingling, seizures,  Focal weakness, Loss of consciousness,  Tremor, insomnia, depression, anxiety, and suicidal ideation.     History   Social History  . Marital Status: Single    Spouse Name: N/A    Number of Children: N/A  . Years of Education: N/A   Occupational History  . Not on file.   Social History Main Topics  . Smoking status: Never Smoker   . Smokeless tobacco: Not on file  . Alcohol Use: 0.6 oz/week    1 Glasses of wine per week  . Drug Use: Not on file  . Sexual Activity: Not on file   Other Topics Concern  . Not on file   Social History Narrative  . No narrative on file    Objective:  Filed Vitals:   03/26/13 1442  BP: 142/88  Pulse: 84  Temp: 98.4 F (36.9 C)  Resp: 18     General appearance: alert, cooperative and appears stated age Ears: normal TM's and external ear canals both ears Throat: lips, mucosa, and tongue normal; teeth and gums normal Neck: no adenopathy, no carotid bruit, supple, symmetrical, trachea midline and thyroid not enlarged, symmetric, no tenderness/mass/nodules Back: symmetric, no curvature. ROM normal. No CVA tenderness. Lungs: clear to auscultation bilaterally Heart: regular rate and rhythm, S1, S2 normal, no murmur, click, rub or  gallop Abdomen: soft, non-tender; bowel sounds normal; no masses,  no organomegaly Pulses: 2+ and symmetric Skin: Skin color, texture, turgor normal. No rashes or lesions Lymph nodes: Cervical, supraclavicular, and axillary nodes normal. Neuro: positive tinels sign left wrist   Assessment and Plan:  Obesity (BMI 30-39.9) With snoring and hypertension.  Discussed risk for OSA.  Asked patient to talk with brother about breathing pattern  No daytime hypersomnolence. I have addressed  BMI and recommended a low glycemic index diet utilizing smaller more frequent meals to increase  metabolism.  I have also recommended that patient start exercising with a goal of 30 minutes of aerobic exercise a minimum of 5 days per week. Screening for lipid disorders, thyroid and diabetes to be done today.     Hypertension improved with amlodipine.  Adding furosemide prn edema.   Carpal tunnel syndrome of left wrist Adding NSAID.  Continue use of wrist splint.    Updated Medication List Outpatient Encounter Prescriptions as of 03/26/2013  Medication Sig  . amLODipine (NORVASC) 10 MG tablet Take 1 tablet (10 mg total) by mouth daily.  Marland Kitchen atorvastatin (LIPITOR) 20 MG tablet Take 1 tablet (20 mg total) by mouth daily.  . Multiple Vitamins-Minerals (MULTIVITAMIN WITH MINERALS) tablet Take 1 tablet by mouth daily.  . [DISCONTINUED] ibuprofen (ADVIL,MOTRIN) 200 MG tablet Take 200 mg by mouth as needed.  . furosemide (LASIX) 20 MG tablet Take 1 tablet (20 mg total) by mouth daily.  . meloxicam (MOBIC) 15 MG tablet Take 1 tablet (15 mg total) by mouth daily.     No orders of the defined types were placed in this encounter.    Return in about 3 months (around 06/23/2013).

## 2013-03-27 NOTE — Assessment & Plan Note (Signed)
Adding NSAID.  Continue use of wrist splint.

## 2013-03-27 NOTE — Assessment & Plan Note (Signed)
improved with amlodipine.  Adding furosemide prn edema.

## 2013-03-28 ENCOUNTER — Encounter: Payer: Self-pay | Admitting: Internal Medicine

## 2013-06-21 ENCOUNTER — Encounter: Payer: Self-pay | Admitting: Internal Medicine

## 2013-07-01 ENCOUNTER — Encounter: Payer: Self-pay | Admitting: Internal Medicine

## 2013-07-01 ENCOUNTER — Ambulatory Visit (INDEPENDENT_AMBULATORY_CARE_PROVIDER_SITE_OTHER): Payer: BC Managed Care – PPO | Admitting: Internal Medicine

## 2013-07-01 VITALS — BP 126/78 | HR 80 | Temp 98.2°F | Resp 16 | Wt 201.5 lb

## 2013-07-01 DIAGNOSIS — R609 Edema, unspecified: Secondary | ICD-10-CM

## 2013-07-01 DIAGNOSIS — I10 Essential (primary) hypertension: Secondary | ICD-10-CM

## 2013-07-01 DIAGNOSIS — Z79899 Other long term (current) drug therapy: Secondary | ICD-10-CM

## 2013-07-01 MED ORDER — LOSARTAN POTASSIUM-HCTZ 100-12.5 MG PO TABS: 1.0000 | ORAL_TABLET | Freq: Every day | ORAL | Status: DC

## 2013-07-01 NOTE — Progress Notes (Signed)
Patient ID: Victoria Hardin, female   DOB: 01/11/1966, 47 y.o.   MRN: [id number]   Patient Active Problem List   Diagnosis Date Noted  . Edema 07/02/2013  . Carpal tunnel syndrome of left wrist 03/25/2013  . Other and unspecified hyperlipidemia 02/04/2013  . Obesity (BMI 30-39.9) 07/08/2011  . Hypertension 07/08/2011  . Shingles outbreak   . History of kidney stones     Subjective:  CC:   Chief Complaint  Patient presents with  . Acute Visit    bilateral edema, patient staed when standing on legs has trouble with edema.  . Leg Swelling    Notice more since coming back from the beach.    HPI:   Victoria Hardin is a 48 y.o. female who presents for chronic fluid retention in both legs.  First noticed when drive to the beach.  improves with upright position .  No calf pain or history of VTE     Past Medical History  Diagnosis Date  . Shingles outbreak   . History of kidney stones     Past Surgical History  Procedure Laterality Date  . Abdominal hysterectomy         The following portions of the patient's history were reviewed and updated as appropriate: Allergies, current medications, and problem list.    Review of Systems:   Patient denies headache, fevers, malaise, unintentional weight loss, skin rash, eye pain, sinus congestion and sinus pain, sore throat, dysphagia,  hemoptysis , cough, dyspnea, wheezing, chest pain, palpitations, orthopnea, edema, abdominal pain, nausea, melena, diarrhea, constipation, flank pain, dysuria, hematuria, urinary  Frequency, nocturia, numbness, tingling, seizures,  Focal weakness, Loss of consciousness,  Tremor, insomnia, depression, anxiety, and suicidal ideation.     History   Social History  . Marital Status: Single    Spouse Name: N/A    Number of Children: N/A  . Years of Education: N/A   Occupational History  . Not on file.   Social History Main Topics  . Smoking status: Never Smoker   . Smokeless tobacco: Not on file   . Alcohol Use: 0.6 oz/week    1 Glasses of wine per week  . Drug Use: Not on file  . Sexual Activity: Not on file   Other Topics Concern  . Not on file   Social History Narrative  . No narrative on file    Objective:  Filed Vitals:   07/01/13 1616  BP: 126/78  Pulse: 80  Temp: 98.2 F (36.8 C)  Resp: 16     General appearance: alert, cooperative and appears stated age Ears: normal TM's and external ear canals both ears Throat: lips, mucosa, and tongue normal; teeth and gums normal Neck: no adenopathy, no carotid bruit, supple, symmetrical, trachea midline and thyroid not enlarged, symmetric, no tenderness/mass/nodules Back: symmetric, no curvature. ROM normal. No CVA tenderness. Lungs: clear to auscultation bilaterally Heart: regular rate and rhythm, S1, S2 normal, no murmur, click, rub or gallop Abdomen: soft, non-tender; bowel sounds normal; no masses,  no organomegaly Pulses: 2+ and symmetric Skin: Skin color, texture, turgor normal. No rashes or lesions Lymph nodes: Cervical, supraclavicular, and axillary nodes normal.  Assessment and Plan:  Edema Suspect vi aggravated by amlodipine.  medication change.,  Ultrasound,  fcompression stockings.   Hypertension changing med's due to amlodipine causing edema.    Updated Medication List Outpatient Encounter Prescriptions as of 07/01/2013  Medication Sig  . atorvastatin (LIPITOR) 20 MG tablet Take 1 tablet (20 mg total) by mouth  daily.  . meloxicam (MOBIC) 15 MG tablet Take 1 tablet (15 mg total) by mouth daily.  . Multiple Vitamins-Minerals (MULTIVITAMIN WITH MINERALS) tablet Take 1 tablet by mouth daily.  . [DISCONTINUED] amLODipine (NORVASC) 10 MG tablet Take 1 tablet (10 mg total) by mouth daily.  . [DISCONTINUED] furosemide (LASIX) 20 MG tablet Take 1 tablet (20 mg total) by mouth daily.  Marland Kitchen losartan-hydrochlorothiazide (HYZAAR) 100-12.5 MG per tablet Take 1 tablet by mouth daily.     Orders Placed This  Encounter  Procedures  . Comp Met (CMET)  . Ambulatory referral to Vascular Surgery    No Follow-up on file.

## 2013-07-01 NOTE — Patient Instructions (Signed)
I am changing medications from amlodipin and furosemide  to one pill (losartan/hct)   The dose is one tablet daily in the morning .  I will set up Ultrasound  to be set up to to rule out venous insufficiency  Venous Stasis or Chronic Venous Insufficiency Chronic venous insufficiency, also called venous stasis, is a condition that affects the veins in the legs. The condition prevents blood from being pumped through these veins effectively. Blood may no longer be pumped effectively from the legs back to the heart. This condition can range from mild to severe. With proper treatment, you should be able to continue with an active life. CAUSES  Chronic venous insufficiency occurs when the vein walls become stretched, weakened, or damaged or when valves within the vein are damaged. Some common causes of this include:  High blood pressure inside the veins (venous hypertension).  Increased blood pressure in the leg veins from long periods of sitting or standing.  A blood clot that blocks blood flow in a vein (deep vein thrombosis).  Inflammation of a superficial vein (phlebitis) that causes a blood clot to form. RISK FACTORS Various things can make you more likely to develop chronic venous insufficiency, including:  Family history of this condition.  Obesity.  Pregnancy.  Sedentary lifestyle.  Smoking.  Jobs requiring long periods of standing or sitting in one place.  Being a certain age. Women in their 5740s and 7450s and men in their 1470s are more likely to develop this condition. SIGNS AND SYMPTOMS  Symptoms may include:   Varicose veins.  Skin breakdown or ulcers.  Reddened or discolored skin on the leg.  Brown, smooth, tight, and painful skin just above the ankle, usually on the inside surface (lipodermatosclerosis).  Swelling. DIAGNOSIS  To diagnose this condition, your health care provider will take a medical history and do a physical exam. The following tests may be ordered  to confirm the diagnosis:  Duplex ultrasound A procedure that produces a picture of a blood vessel and nearby organs and also provides information on blood flow through the blood vessel.  Plethysmography A procedure that tests blood flow.  A venogram, or venography A procedure used to look at the veins using X-ray and dye. TREATMENT The goals of treatment are to help you return to an active life and to minimize pain or disability. Treatment will depend on the severity of the condition. Medical procedures may be needed for severe cases. Treatment options may include:   Use of compression stockings. These can help with symptoms and lower the chances of the problem getting worse, but they do not cure the problem.  Sclerotherapy A procedure involving an injection of a material that "dissolves" the damaged veins. Other veins in the network of blood vessels take over the function of the damaged veins.  Surgery to remove the vein or cut off blood flow through the vein (vein stripping or laser ablation surgery).  Surgery to repair a valve. HOME CARE INSTRUCTIONS   Wear compression stockings as directed by your health care provider.  Only take over-the-counter or prescription medicines for pain, discomfort, or fever as directed by your health care provider.  Follow up with your health care provider as directed. SEEK MEDICAL CARE IF:   You have redness, swelling, or increasing pain in the affected area.  You see a red streak or line that extends up or down from the affected area.  You have a breakdown or loss of skin in the affected area,  even if the breakdown is small.  You have an injury to the affected area. SEEK IMMEDIATE MEDICAL CARE IF:   You have an injury and open wound in the affected area.  Your pain is severe and does not improve with medicine.  You have sudden numbness or weakness in the foot or ankle below the affected area, or you have trouble moving your foot or  ankle.  You have a fever or persistent symptoms for more than 2 3 days.  You have a fever and your symptoms suddenly get worse. MAKE SURE YOU:   Understand these instructions.  Will watch your condition.  Will get help right away if you are not doing well or get worse. Document Released: 06/06/2006 Document Revised: 11/21/2012 Document Reviewed: 10/08/2012 [facility] Patient Information 2014 [place], [place].

## 2013-07-01 NOTE — Progress Notes (Signed)
Pre-visit discussion using our clinic review tool. No additional management support is needed unless otherwise documented below in the visit note.  

## 2013-07-02 DIAGNOSIS — R609 Edema, unspecified: Secondary | ICD-10-CM | POA: Insufficient documentation

## 2013-07-02 NOTE — Assessment & Plan Note (Signed)
Suspect vi aggravated by amlodipine.  medication change.,  Ultrasound,  fcompression stockings.

## 2013-07-02 NOTE — Assessment & Plan Note (Signed)
changing med's due to amlodipine causing edema.

## 2013-07-30 ENCOUNTER — Telehealth: Payer: Self-pay | Admitting: Internal Medicine

## 2013-07-30 DIAGNOSIS — R609 Edema, unspecified: Secondary | ICD-10-CM

## 2013-07-30 NOTE — Telephone Encounter (Signed)
Her fluid retention is not caused venous insufficiency, it was  ruled out with recent vascular studies, by [facility]

## 2013-07-31 NOTE — Telephone Encounter (Signed)
Patient notified of results.

## 2013-08-01 ENCOUNTER — Encounter: Payer: Self-pay | Admitting: Internal Medicine

## 2013-08-02 ENCOUNTER — Other Ambulatory Visit: Payer: Self-pay | Admitting: Internal Medicine

## 2013-08-02 MED ORDER — MELOXICAM 15 MG PO TABS: 15.0000 mg | ORAL_TABLET | Freq: Every day | ORAL | Status: DC

## 2013-08-02 NOTE — Telephone Encounter (Signed)
Last visit 07/01/13, ok refill Meloxicam?

## 2013-08-09 ENCOUNTER — Encounter: Payer: Self-pay | Admitting: Internal Medicine

## 2013-12-12 ENCOUNTER — Other Ambulatory Visit: Payer: Self-pay

## 2013-12-12 DIAGNOSIS — Z1231 Encounter for screening mammogram for malignant neoplasm of breast: Secondary | ICD-10-CM

## 2013-12-25 ENCOUNTER — Ambulatory Visit
Admission: RE | Admit: 2013-12-25 | Discharge: 2013-12-25 | Disposition: A | Discharge status: Home / Self Care | Payer: BC Managed Care – PPO | Source: Ambulatory Visit

## 2013-12-25 DIAGNOSIS — Z1231 Encounter for screening mammogram for malignant neoplasm of breast: Secondary | ICD-10-CM

## 2014-02-04 ENCOUNTER — Other Ambulatory Visit: Payer: Self-pay | Admitting: Internal Medicine

## 2014-02-27 ENCOUNTER — Telehealth: Payer: Self-pay | Admitting: Internal Medicine

## 2014-02-27 NOTE — Telephone Encounter (Signed)
Patient called to see if the numerous call she was getting was concerning her appointment, no notes in Epic returned call advised patient must be automated.

## 2014-03-04 ENCOUNTER — Other Ambulatory Visit: Payer: Self-pay | Admitting: Internal Medicine

## 2014-03-04 ENCOUNTER — Ambulatory Visit (INDEPENDENT_AMBULATORY_CARE_PROVIDER_SITE_OTHER): Payer: BC Managed Care – PPO | Admitting: Internal Medicine

## 2014-03-04 ENCOUNTER — Encounter: Payer: Self-pay | Admitting: Internal Medicine

## 2014-03-04 VITALS — BP 138/86 | HR 75 | Temp 98.6°F | Resp 16 | Ht 63.0 in | Wt 210.2 lb

## 2014-03-04 DIAGNOSIS — I10 Essential (primary) hypertension: Secondary | ICD-10-CM

## 2014-03-04 DIAGNOSIS — E669 Obesity, unspecified: Secondary | ICD-10-CM

## 2014-03-04 DIAGNOSIS — E611 Iron deficiency: Secondary | ICD-10-CM

## 2014-03-04 DIAGNOSIS — M791 Myalgia, unspecified site: Secondary | ICD-10-CM

## 2014-03-04 DIAGNOSIS — G2581 Restless legs syndrome: Secondary | ICD-10-CM

## 2014-03-04 DIAGNOSIS — E785 Hyperlipidemia, unspecified: Secondary | ICD-10-CM

## 2014-03-04 DIAGNOSIS — Z79899 Other long term (current) drug therapy: Secondary | ICD-10-CM

## 2014-03-04 MED ORDER — LOSARTAN POTASSIUM-HCTZ 100-25 MG PO TABS: 1.0000 | ORAL_TABLET | Freq: Every day | ORAL | Status: DC

## 2014-03-04 NOTE — Patient Instructions (Signed)
Suspend the atorvastatin,  If the muscle pain improves after a few weeks,  We have our anxwer  If not,  We will try the medication for restless legs  Restless Legs Syndrome Restless legs syndrome is a movement disorder. It may also be called a sensorimotor disorder.  CAUSES  No one knows what specifically causes restless legs syndrome, but it tends to run in families. It is also more common in people with low iron, in pregnancy, in people who need dialysis, and those with nerve damage (neuropathy).Some medications may make restless legs syndrome worse.Those medications include drugs to treat high blood pressure, some heart conditions, nausea, colds, allergies, and depression. SYMPTOMS Symptoms include uncomfortable sensations in the legs. These leg sensations are worse during periods of inactivity or rest. They are also worse while sitting or lying down. Individuals that have the disorder describe sensations in the legs that feel like:  Pulling.  Drawing.  Crawling.  Worming.  Boring.  Tingling.  Pins and needles.  Prickling.  Pain. The sensations are usually accompanied by an overwhelming urge to move the legs. Sudden muscle jerks may also occur. Movement provides temporary relief from the discomfort. In rare cases, the arms may also be affected. Symptoms may interfere with going to sleep (sleep onset insomnia). Restless legs syndrome may also be related to periodic limb movement disorder (PLMD). PLMD is another more common motor disorder. It also causes interrupted sleep. The symptoms from PLMD usually occur most often when you are awake. TREATMENT  Treatment for restless legs syndrome is symptomatic. This means that the symptoms are treated.   Massage and cold compresses may provide temporary relief.  Walk, stretch, or take a cold or hot bath.  Get regular exercise and a good night's sleep.  Avoid caffeine, alcohol, nicotine, and medications that can make it worse.  Do  activities that provide mental stimulation like discussions, needlework, and video games. These may be helpful if you are not able to walk or stretch. Some medications are effective in relieving the symptoms. However, many of these medications have side effects. Ask your caregiver about medications that may help your symptoms. Correcting iron deficiency may improve symptoms for some patients. Document Released: 01/21/2002 Document Revised: 06/17/2013 Document Reviewed: 04/29/2010 [facility] Patient Information 2015 [place], [place]. This information is not intended to replace advice given to you by your health care provider. Make sure you discuss any questions you have with your health care provider.

## 2014-03-04 NOTE — Progress Notes (Signed)
Pre-visit discussion using our clinic review tool. No additional management support is needed unless otherwise documented below in the visit note.  

## 2014-03-04 NOTE — Progress Notes (Signed)
Patient ID: Victoria Hardin, female   DOB: [birth year], 49 y.o.   MRN: [id number]  Patient Active Problem List   Diagnosis Date Noted  . Iron deficiency 03/07/2014  . Edema 07/02/2013  . Carpal tunnel syndrome of left wrist 03/25/2013  . Hyperlipidemia 02/04/2013  . Obesity (BMI 30-39.9) 07/08/2011  . Hypertension 07/08/2011  . Shingles outbreak   . History of kidney stones     Subjective:  CC:   Chief Complaint  Patient presents with  . Follow-up    medication refills    HPI:   Victoria Hardin is a 49 y.o. female who presents for  follow up on chronic conditions including hypertension including hypertension and hyperlipidemia.  She is tolerating her medications without adverse effects but reports that she continues to have leg pain .  She reports that her left leg still bothering her  On the  lateral side, but the sympotms in the right leg have resolved.  The foot is not involved and the symptoms are present in the evening and aggravated by lying still and improved with walking. She describes it as throbbing. Thinks it might be restless leg syndrome.      Past Medical History  Diagnosis Date  . Shingles outbreak   . History of kidney stones     Past Surgical History  Procedure Laterality Date  . Abdominal hysterectomy         The following portions of the patient's history were reviewed and updated as appropriate: Allergies, current medications, and problem list.    Review of Systems:   Patient denies headache, fevers, malaise, unintentional weight loss, skin rash, eye pain, sinus congestion and sinus pain, sore throat, dysphagia,  hemoptysis , cough, dyspnea, wheezing, chest pain, palpitations, orthopnea, edema, abdominal pain, nausea, melena, diarrhea, constipation, flank pain, dysuria, hematuria, urinary  Frequency, nocturia, numbness, tingling, seizures,  Focal weakness, Loss of consciousness,  Tremor, insomnia, depression, anxiety, and suicidal ideation.      History   Social History  . Marital Status: Single    Spouse Name: N/A    Number of Children: N/A  . Years of Education: N/A   Occupational History  . Not on file.   Social History Main Topics  . Smoking status: Never Smoker   . Smokeless tobacco: Not on file  . Alcohol Use: 0.6 oz/week    1 Glasses of wine per week  . Drug Use: Not on file  . Sexual Activity: Not on file   Other Topics Concern  . Not on file   Social History Narrative    Objective:  Filed Vitals:   03/04/14 1458  BP: 138/86  Pulse: 75  Temp: 98.6 F (37 C)  Resp: 16     General appearance: alert, cooperative and appears stated age Ears: normal TM's and external ear canals both ears Throat: lips, mucosa, and tongue normal; teeth and gums normal Neck: no adenopathy, no carotid bruit, supple, symmetrical, trachea midline and thyroid not enlarged, symmetric, no tenderness/mass/nodules Back: symmetric, no curvature. ROM normal. No CVA tenderness. Lungs: clear to auscultation bilaterally Heart: regular rate and rhythm, S1, S2 normal, no murmur, click, rub or gallop Abdomen: soft, non-tender; bowel sounds normal; no masses,  no organomegaly Pulses: 2+ and symmetric Skin: Skin color, texture, turgor normal. No rashes or lesions Lymph nodes: Cervical, supraclavicular, and axillary nodes normal.  Assessment and Plan:  Hypertension Well controlled on current regimen. Renal function stable, no changes today.  Lab Results  Component Value Date  CREATININE 0.9 03/26/2013   Lab Results  Component Value Date   NA 140 03/26/2013   K 3.8 03/26/2013   CL 107 03/26/2013   CO2 27 03/26/2013     Iron deficiency Found on todays labs.  Will treat  , prior to treating for restless legs   Hyperlipidemia She has bee taking atorvastatin for risk reduction of CAD but given her persistent leg pain, I advised her to suspend it for three months.    Obesity (BMI 30-39.9) I have addressed  BMI and  recommended a low glycemic index diet utilizing smaller more frequent meals to increase metabolism.  I have also recommended that patient start exercising with a goal of 30 minutes of aerobic exercise a minimum of 5 days per week.     Updated Medication List Outpatient Encounter Prescriptions as of 03/04/2014  Medication Sig  . atorvastatin (LIPITOR) 20 MG tablet TAKE ONE (1) TABLET BY MOUTH EVERY DAY  . meloxicam (MOBIC) 15 MG tablet Take 1 tablet (15 mg total) by mouth daily.  . Multiple Vitamins-Minerals (MULTIVITAMIN WITH MINERALS) tablet Take 1 tablet by mouth daily.  . [DISCONTINUED] losartan-hydrochlorothiazide (HYZAAR) 100-12.5 MG per tablet Take 1 tablet by mouth daily.  . FeAspGl-FeFum-B12-FA-C-Succ Ac (FERREX 28) TABS Take 1 tablet by mouth 2 (two) times daily with a meal.  . losartan-hydrochlorothiazide (HYZAAR) 100-25 MG per tablet Take 1 tablet by mouth daily.     Orders Placed This Encounter  Procedures  . CBC with Differential  . Ferritin  . Iron and TIBC  . CK    No Follow-up on file.

## 2014-03-05 LAB — CBC WITH DIFFERENTIAL/PLATELET
BASOS ABS: 0 10*3/uL (ref 0.0–0.1)
BASOS PCT: 0.4 % (ref 0.0–3.0)
Eosinophils Absolute: 0.1 10*3/uL (ref 0.0–0.7)
Eosinophils Relative: 1.3 % (ref 0.0–5.0)
HEMATOCRIT: 38.6 % (ref 36.0–46.0)
HEMOGLOBIN: 12.9 g/dL (ref 12.0–15.0)
Lymphocytes Relative: 33.2 % (ref 12.0–46.0)
Lymphs Abs: 1.8 10*3/uL (ref 0.7–4.0)
MCHC: 33.5 g/dL (ref 30.0–36.0)
MCV: 84.1 fl (ref 78.0–100.0)
MONO ABS: 0.4 10*3/uL (ref 0.1–1.0)
Monocytes Relative: 8 % (ref 3.0–12.0)
NEUTROS ABS: 3 10*3/uL (ref 1.4–7.7)
NEUTROS PCT: 57.1 % (ref 43.0–77.0)
Platelets: 191 10*3/uL (ref 150.0–400.0)
RBC: 4.59 Mil/uL (ref 3.87–5.11)
RDW: 14.2 % (ref 11.5–15.5)
WBC: 5.3 10*3/uL (ref 4.0–10.5)

## 2014-03-05 LAB — FERRITIN: Ferritin: 49.1 ng/mL (ref 10.0–291.0)

## 2014-03-05 LAB — CK: Total CK: 128 U/L (ref 7–177)

## 2014-03-07 ENCOUNTER — Encounter: Payer: Self-pay | Admitting: Internal Medicine

## 2014-03-07 DIAGNOSIS — E611 Iron deficiency: Secondary | ICD-10-CM | POA: Insufficient documentation

## 2014-03-07 MED ORDER — FERREX 28 PO TABS: 1.0000 | ORAL_TABLET | Freq: Two times a day (BID) | ORAL | Status: DC

## 2014-03-07 NOTE — Assessment & Plan Note (Signed)
Found on todays labs.  Will treat  , prior to treating for restless legs

## 2014-03-07 NOTE — Assessment & Plan Note (Signed)
Well controlled on current regimen. Renal function stable, no changes today.  Lab Results  Component Value Date   CREATININE 0.9 03/26/2013   Lab Results  Component Value Date   NA 140 03/26/2013   K 3.8 03/26/2013   CL 107 03/26/2013   CO2 27 03/26/2013

## 2014-03-07 NOTE — Assessment & Plan Note (Signed)
I have addressed  BMI and recommended a low glycemic index diet utilizing smaller more frequent meals to increase metabolism.  I have also recommended that patient start exercising with a goal of 30 minutes of aerobic exercise a minimum of 5 days per week.  

## 2014-03-07 NOTE — Addendum Note (Signed)
Addended by: Sherlene ShamsULLO, Ardella Chhim L on: 03/07/2014 12:36 PM   Modules accepted: Orders

## 2014-03-07 NOTE — Assessment & Plan Note (Signed)
She has bee taking atorvastatin for risk reduction of CAD but given her persistent leg pain, I advised her to suspend it for three months.

## 2014-03-10 ENCOUNTER — Telehealth: Payer: Self-pay

## 2014-03-10 LAB — COMPREHENSIVE METABOLIC PANEL

## 2014-03-10 LAB — IRON AND TIBC

## 2014-03-10 MED ORDER — FERREX 28 PO TABS: 1.0000 | ORAL_TABLET | Freq: Every day | ORAL | Status: DC

## 2014-03-10 NOTE — Telephone Encounter (Signed)
OK TO CHANGE TO ONCE DAILY,  WIL SEND NEW RX

## 2014-03-10 NOTE — Telephone Encounter (Signed)
Faxed to pharmacy

## 2014-03-10 NOTE — Telephone Encounter (Signed)
Dr. Darrick Huntsmanullo ordered Ferrex 28 take one tablet by mouth two times daily with meals. Pharmacy replied: Please verify directions- 1 tablet daily is recommended dose; Insurance rejected because of high dose. Please advise.

## 2014-03-17 ENCOUNTER — Encounter: Payer: Self-pay | Admitting: Internal Medicine

## 2014-03-17 ENCOUNTER — Other Ambulatory Visit (HOSPITAL_COMMUNITY)
Admission: RE | Admit: 2014-03-17 | Discharge: 2014-03-17 | Disposition: A | Discharge status: Home / Self Care | Payer: BC Managed Care – PPO | Source: Ambulatory Visit | Attending: Internal Medicine | Admitting: Internal Medicine

## 2014-03-17 ENCOUNTER — Ambulatory Visit (INDEPENDENT_AMBULATORY_CARE_PROVIDER_SITE_OTHER): Payer: BC Managed Care – PPO | Admitting: Internal Medicine

## 2014-03-17 VITALS — BP 118/84 | HR 77 | Temp 98.2°F | Resp 16 | Ht 63.0 in | Wt 207.5 lb

## 2014-03-17 DIAGNOSIS — Z79899 Other long term (current) drug therapy: Secondary | ICD-10-CM

## 2014-03-17 DIAGNOSIS — Z1151 Encounter for screening for human papillomavirus (HPV): Secondary | ICD-10-CM | POA: Diagnosis present

## 2014-03-17 DIAGNOSIS — Z124 Encounter for screening for malignant neoplasm of cervix: Secondary | ICD-10-CM

## 2014-03-17 DIAGNOSIS — Z0000 Encounter for general adult medical examination without abnormal findings: Secondary | ICD-10-CM

## 2014-03-17 DIAGNOSIS — M79606 Pain in leg, unspecified: Secondary | ICD-10-CM

## 2014-03-17 DIAGNOSIS — Z01419 Encounter for gynecological examination (general) (routine) without abnormal findings: Secondary | ICD-10-CM | POA: Insufficient documentation

## 2014-03-17 DIAGNOSIS — E876 Hypokalemia: Secondary | ICD-10-CM

## 2014-03-17 DIAGNOSIS — E669 Obesity, unspecified: Secondary | ICD-10-CM

## 2014-03-17 DIAGNOSIS — E611 Iron deficiency: Secondary | ICD-10-CM

## 2014-03-17 MED ORDER — CYCLOBENZAPRINE HCL 10 MG PO TABS: 10.0000 mg | ORAL_TABLET | Freq: Three times a day (TID) | ORAL | Status: DC | PRN

## 2014-03-17 NOTE — Progress Notes (Signed)
Pre-visit discussion using our clinic review tool. No additional management support is needed unless otherwise documented below in the visit note.  

## 2014-03-17 NOTE — Progress Notes (Signed)
Patient ID: Victoria Hardin Peachey, female   DOB: [birth year], 49 y.o.   MRN: [id number]  Subjective:     Victoria Hardin Bevel is a 49 y.o. female and is here for a comprehensive physical exam. The patient reports no new problems.  Her leg pain  Is still a problem, but she has started iron therapy and is tolerating the medication without nausea or constipation. Marland Kitchen.  History   Social History  . Marital Status: Single    Spouse Name: N/A    Number of Children: N/A  . Years of Education: N/A   Occupational History  . Not on file.   Social History Main Topics  . Smoking status: Never Smoker   . Smokeless tobacco: Not on file  . Alcohol Use: 0.6 oz/week    1 Glasses of wine per week  . Drug Use: Not on file  . Sexual Activity: Not on file   Other Topics Concern  . Not on file   Social History Narrative   Health Maintenance  Topic Date Due  . INFLUENZA VACCINE  03/16/2016 (Originally 09/14/2013)  . PAP SMEAR  03/16/2017 (Originally 07/09/1982)  . TETANUS/TDAP  01/15/2023    The following portions of the patient's history were reviewed and updated as appropriate: allergies, current medications, past family history, past medical history, past social history, past surgical history and problem list.  Review of Systems  Patient denies headache, fevers, malaise, unintentional weight loss, skin rash, eye pain, sinus congestion and sinus pain, sore throat, dysphagia,  hemoptysis , cough, dyspnea, wheezing, chest pain, palpitations, orthopnea, edema, abdominal pain, nausea, melena, diarrhea, constipation, flank pain, dysuria, hematuria, urinary  Frequency, nocturia, numbness, tingling, seizures,  Focal weakness, Loss of consciousness,  Tremor, insomnia, depression, anxiety, and suicidal ideation.      Objective:    BP 118/84 mmHg  Pulse 77  Temp(Src) 98.2 F (36.8 C) (Oral)  Resp 16  Ht 5\' 3"  (1.6 m)  Wt 207 lb 8 oz (94.121 kg)  BMI 36.77 kg/m2  SpO2 97%  General Appearance:    Alert,  cooperative, no distress, appears stated age  Head:    Normocephalic, without obvious abnormality, atraumatic  Eyes:    PERRL, conjunctiva/corneas clear, EOM's intact, fundi    benign, both eyes  Ears:    Normal TM's and external ear canals, both ears  Nose:   Nares normal, septum midline, mucosa normal, no drainage    or sinus tenderness  Throat:   Lips, mucosa, and tongue normal; teeth and gums normal  Neck:   Supple, symmetrical, trachea midline, no adenopathy;    thyroid:  no enlargement/tenderness/nodules; no carotid   bruit or JVD  Back:     Symmetric, no curvature, ROM normal, no CVA tenderness  Lungs:     Clear to auscultation bilaterally, respirations unlabored  Chest Wall:    No tenderness or deformity   Heart:    Regular rate and rhythm, S1 and S2 normal, no murmur, rub   or gallop  Breast Exam:    No tenderness, masses, or nipple abnormality  Abdomen:     Soft, non-tender, bowel sounds active all four quadrants,    no masses, no organomegaly  Genitalia:    Pelvic: cervix normal in appearance, external genitalia normal, no adnexal masses or tenderness, no cervical motion tenderness, rectovaginal septum normal, uterus normal size, shape, and consistency and vagina normal without discharge  Extremities:   Extremities normal, atraumatic, no cyanosis or edema  Pulses:   2+ and symmetric all  extremities  Skin:   Skin color, texture, turgor normal, no rashes or lesions  Lymph nodes:   Cervical, supraclavicular, and axillary nodes normal  Neurologic:   CNII-XII intact, normal strength, sensation and reflexes    throughout     .    Assessment and Plan:   Problem List Items Addressed This Visit    Encounter for preventive health examination    Annual wellness  exam was done as well as a comprehensive physical exam and management of acute and chronic conditions .  During the course of the visit the patient was educated and counseled about appropriate screening and preventive  services including :  diabetes screening, lipid analysis with projected  10 year  risk for CAD , nutrition counseling, colorectal cancer screening, and recommended immunizations.  Printed recommendations for health maintenance screenings was given.        Iron deficiency    Suggested on recent iron studies done to investigate symptoms suggestive of RLS.  . Lab values are no longer in chart and pateint is now taking iron supplements  Will need repeat studies in one month        Lower extremity pain    I have suspended statin and started iron supplements based on currently unavailable labs suggesting iron deficiency,  Patient advised to return or call in one month and if symptoms have not improved, will resume statin, ,stop iron and start requip.       Obesity (BMI 30-39.9)    I have addressed  BMI and recommended wt loss of 10% of body weigh over the next 6 months using a low glycemic index diet and regular exercise a minimum of 5 days per week.         Other Visit Diagnoses    Cervical cancer screening    -  Primary    Relevant Orders    Cytology - PAP    Encounter for long-term (current) use of medications

## 2014-03-17 NOTE — Patient Instructions (Signed)
I have prescribed flexeril to help you rest at night until the iron tablets and the    Health Maintenance Adopting a healthy lifestyle and getting preventive care can go a long way to promote health and wellness. Talk with your health care provider about what schedule of regular examinations is right for you. This is a good chance for you to check in with your provider about disease prevention and staying healthy. In between checkups, there are plenty of things you can do on your own. Experts have done a lot of research about which lifestyle changes and preventive measures are most likely to keep you healthy. Ask your health care provider for more information. WEIGHT AND DIET  Eat a healthy diet  Be sure to include plenty of vegetables, fruits, low-fat dairy products, and lean protein.  Do not eat a lot of foods high in solid fats, added sugars, or salt.  Get regular exercise. This is one of the most important things you can do for your health.  Most adults should exercise for at least 150 minutes each week. The exercise should increase your heart rate and make you sweat (moderate-intensity exercise).  Most adults should also do strengthening exercises at least twice a week. This is in addition to the moderate-intensity exercise.  Maintain a healthy weight  Body mass index (BMI) is a measurement that can be used to identify possible weight problems. It estimates body fat based on height and weight. Your health care provider can help determine your BMI and help you achieve or maintain a healthy weight.  For females 40 years of age and older:   A BMI below 18.5 is considered underweight.  A BMI of 18.5 to 24.9 is normal.  A BMI of 25 to 29.9 is considered overweight.  A BMI of 30 and above is considered obese.  Watch levels of cholesterol and blood lipids  You should start having your blood tested for lipids and cholesterol at 50 years of age, then have this test every 5  years.  You may need to have your cholesterol levels checked more often if:  Your lipid or cholesterol levels are high.  You are older than 50 years of age.  You are at high risk for heart disease.  CANCER SCREENING   Lung Cancer  Lung cancer screening is recommended for adults 45-55 years old who are at high risk for lung cancer because of a history of smoking.  A yearly low-dose CT scan of the lungs is recommended for people who:  Currently smoke.  Have quit within the past 15 years.  Have at least a 30-pack-year history of smoking. A pack year is smoking an average of one pack of cigarettes a day for 1 year.  Yearly screening should continue until it has been 15 years since you quit.  Yearly screening should stop if you develop a health problem that would prevent you from having lung cancer treatment.  Breast Cancer  Practice breast self-awareness. This means understanding how your breasts normally appear and feel.  It also means doing regular breast self-exams. Let your health care provider know about any changes, no matter how small.  If you are in your 20s or 30s, you should have a clinical breast exam (CBE) by a health care provider every 1-3 years as part of a regular health exam.  If you are 86 or older, have a CBE every year. Also consider having a breast X-ray (mammogram) every year.  If you have  a family history of breast cancer, talk to your health care provider about genetic screening.  If you are at high risk for breast cancer, talk to your health care provider about having an MRI and a mammogram every year.  Breast cancer gene (BRCA) assessment is recommended for women who have family members with BRCA-related cancers. BRCA-related cancers include:  Breast.  Ovarian.  Tubal.  Peritoneal cancers.  Results of the assessment will determine the need for genetic counseling and BRCA1 and BRCA2 testing. Cervical Cancer Routine pelvic examinations to  screen for cervical cancer are no longer recommended for nonpregnant women who are considered low risk for cancer of the pelvic organs (ovaries, uterus, and vagina) and who do not have symptoms. A pelvic examination may be necessary if you have symptoms including those associated with pelvic infections. Ask your health care provider if a screening pelvic exam is right for you.   The Pap test is the screening test for cervical cancer for women who are considered at risk.  If you had a hysterectomy for a problem that was not cancer or a condition that could lead to cancer, then you no longer need Pap tests.  If you are older than 65 years, and you have had normal Pap tests for the past 10 years, you no longer need to have Pap tests.  If you have had past treatment for cervical cancer or a condition that could lead to cancer, you need Pap tests and screening for cancer for at least 20 years after your treatment.  If you no longer get a Pap test, assess your risk factors if they change (such as having a new sexual partner). This can affect whether you should start being screened again.  Some women have medical problems that increase their chance of getting cervical cancer. If this is the case for you, your health care provider may recommend more frequent screening and Pap tests.  The human papillomavirus (HPV) test is another test that may be used for cervical cancer screening. The HPV test looks for the virus that can cause cell changes in the cervix. The cells collected during the Pap test can be tested for HPV.  The HPV test can be used to screen women 58 years of age and older. Getting tested for HPV can extend the interval between normal Pap tests from three to five years.  An HPV test also should be used to screen women of any age who have unclear Pap test results.  After 50 years of age, women should have HPV testing as often as Pap tests.  Colorectal Cancer  This type of cancer can be  detected and often prevented.  Routine colorectal cancer screening usually begins at 50 years of age and continues through 50 years of age.  Your health care provider may recommend screening at an earlier age if you have risk factors for colon cancer.  Your health care provider may also recommend using home test kits to check for hidden blood in the stool.  A small camera at the end of a tube can be used to examine your colon directly (sigmoidoscopy or colonoscopy). This is done to check for the earliest forms of colorectal cancer.  Routine screening usually begins at age 16.  Direct examination of the colon should be repeated every 5-10 years through 50 years of age. However, you may need to be screened more often if early forms of precancerous polyps or small growths are found. Skin Cancer  Check your  skin from head to toe regularly.  Tell your health care provider about any new moles or changes in moles, especially if there is a change in a mole's shape or color.  Also tell your health care provider if you have a mole that is larger than the size of a pencil eraser.  Always use sunscreen. Apply sunscreen liberally and repeatedly throughout the day.  Protect yourself by wearing long sleeves, pants, a wide-brimmed hat, and sunglasses whenever you are outside. HEART DISEASE, DIABETES, AND HIGH BLOOD PRESSURE   Have your blood pressure checked at least every 1-2 years. High blood pressure causes heart disease and increases the risk of stroke.  If you are between 9 years and 46 years old, ask your health care provider if you should take aspirin to prevent strokes.  Have regular diabetes screenings. This involves taking a blood sample to check your fasting blood sugar level.  If you are at a normal weight and have a low risk for diabetes, have this test once every three years after 50 years of age.  If you are overweight and have a high risk for diabetes, consider being tested at a  younger age or more often. PREVENTING INFECTION  Hepatitis B  If you have a higher risk for hepatitis B, you should be screened for this virus. You are considered at high risk for hepatitis B if:  You were born in a country where hepatitis B is common. Ask your health care provider which countries are considered high risk.  Your parents were born in a high-risk country, and you have not been immunized against hepatitis B (hepatitis B vaccine).  You have HIV or AIDS.  You use needles to inject street drugs.  You live with someone who has hepatitis B.  You have had sex with someone who has hepatitis B.  You get hemodialysis treatment.  You take certain medicines for conditions, including cancer, organ transplantation, and autoimmune conditions. Hepatitis C  Blood testing is recommended for:  Everyone born from [birth year] through 1966.  Anyone with known risk factors for hepatitis C. Sexually transmitted infections (STIs)  You should be screened for sexually transmitted infections (STIs) including gonorrhea and chlamydia if:  You are sexually active and are younger than 50 years of age.  You are older than 50 years of age and your health care provider tells you that you are at risk for this type of infection.  Your sexual activity has changed since you were last screened and you are at an increased risk for chlamydia or gonorrhea. Ask your health care provider if you are at risk.  If you do not have HIV, but are at risk, it may be recommended that you take a prescription medicine daily to prevent HIV infection. This is called pre-exposure prophylaxis (PrEP). You are considered at risk if:  You are sexually active and do not regularly use condoms or know the HIV status of your partner(s).  You take drugs by injection.  You are sexually active with a partner who has HIV. Talk with your health care provider about whether you are at high risk of being infected with HIV. If you choose  to begin PrEP, you should first be tested for HIV. You should then be tested every 3 months for as long as you are taking PrEP.  PREGNANCY   If you are premenopausal and you may become pregnant, ask your health care provider about preconception counseling.  If you may become pregnant, take 400  to 800 micrograms (mcg) of folic acid every day.  If you want to prevent pregnancy, talk to your health care provider about birth control (contraception). OSTEOPOROSIS AND MENOPAUSE   Osteoporosis is a disease in which the bones lose minerals and strength with aging. This can result in serious bone fractures. Your risk for osteoporosis can be identified using a bone density scan.  If you are 47 years of age or older, or if you are at risk for osteoporosis and fractures, ask your health care provider if you should be screened.  Ask your health care provider whether you should take a calcium or vitamin D supplement to lower your risk for osteoporosis.  Menopause may have certain physical symptoms and risks.  Hormone replacement therapy may reduce some of these symptoms and risks. Talk to your health care provider about whether hormone replacement therapy is right for you.  HOME CARE INSTRUCTIONS   Schedule regular health, dental, and eye exams.  Stay current with your immunizations.   Do not use any tobacco products including cigarettes, chewing tobacco, or electronic cigarettes.  If you are pregnant, do not drink alcohol.  If you are breastfeeding, limit how much and how often you drink alcohol.  Limit alcohol intake to no more than 1 drink per day for nonpregnant women. One drink equals 12 ounces of beer, 5 ounces of wine, or 1 ounces of hard liquor.  Do not use street drugs.  Do not share needles.  Ask your health care provider for help if you need support or information about quitting drugs.  Tell your health care provider if you often feel depressed.  Tell your health care  provider if you have ever been abused or do not feel safe at home. Document Released: 08/16/2010 Document Revised: 06/17/2013 Document Reviewed: 01/02/2013 [facility] Patient Information 2015 [place], [place]. This information is not intended to replace advice given to you by your health care provider. Make sure you discuss any questions you have with your health care provider.

## 2014-03-18 DIAGNOSIS — M79606 Pain in leg, unspecified: Secondary | ICD-10-CM | POA: Insufficient documentation

## 2014-03-18 DIAGNOSIS — Z0000 Encounter for general adult medical examination without abnormal findings: Secondary | ICD-10-CM | POA: Insufficient documentation

## 2014-03-18 LAB — COMPREHENSIVE METABOLIC PANEL
ALBUMIN: 4.2 g/dL (ref 3.5–5.2)
ALT: 11 U/L (ref 0–35)
AST: 16 U/L (ref 0–37)
Alkaline Phosphatase: 74 U/L (ref 39–117)
BILIRUBIN TOTAL: 0.4 mg/dL (ref 0.2–1.2)
BUN: 14 mg/dL (ref 6–23)
CHLORIDE: 100 meq/L (ref 96–112)
CO2: 30 mEq/L (ref 19–32)
CREATININE: 1.04 mg/dL (ref 0.40–1.20)
Calcium: 9.5 mg/dL (ref 8.4–10.5)
GFR: 72.23 mL/min (ref >60.00)
GLUCOSE: 78 mg/dL (ref 70–99)
Potassium: 3.3 mEq/L — ABNORMAL LOW (ref 3.5–5.1)
Sodium: 136 mEq/L (ref 135–145)
TOTAL PROTEIN: 7.5 g/dL (ref 6.0–8.3)

## 2014-03-18 NOTE — Assessment & Plan Note (Signed)
I have suspended statin and started iron supplements based on currently unavailable labs suggesting iron deficiency,  Patient advised to return or call in one month and if symptoms have not improved, will resume statin, ,stop iron and start requip.

## 2014-03-18 NOTE — Assessment & Plan Note (Signed)
I have addressed  BMI and recommended wt loss of 10% of body weigh over the next 6 months using a low glycemic index diet and regular exercise a minimum of 5 days per week.   

## 2014-03-18 NOTE — Assessment & Plan Note (Addendum)
Suggested on recent iron studies done to investigate symptoms suggestive of RLS.  . Lab values are no longer in chart and pateint is now taking iron supplements  Will need repeat studies in one month

## 2014-03-18 NOTE — Assessment & Plan Note (Signed)

## 2014-03-20 ENCOUNTER — Encounter: Payer: Self-pay | Admitting: *Deleted

## 2014-03-20 DIAGNOSIS — E876 Hypokalemia: Secondary | ICD-10-CM | POA: Insufficient documentation

## 2014-03-20 MED ORDER — POTASSIUM CHLORIDE CRYS ER 20 MEQ PO TBCR: 20.0000 meq | EXTENDED_RELEASE_TABLET | Freq: Every day | ORAL | Status: DC

## 2014-03-20 NOTE — Addendum Note (Signed)
Addended by: Sherlene ShamsULLO, Cookie Pore L on: 03/20/2014 10:35 AM   Modules accepted: Orders, Medications

## 2014-03-21 ENCOUNTER — Other Ambulatory Visit: Payer: Self-pay | Admitting: *Deleted

## 2014-03-21 ENCOUNTER — Other Ambulatory Visit (INDEPENDENT_AMBULATORY_CARE_PROVIDER_SITE_OTHER): Payer: BC Managed Care – PPO

## 2014-03-21 DIAGNOSIS — Z79899 Other long term (current) drug therapy: Secondary | ICD-10-CM

## 2014-03-21 LAB — COMPREHENSIVE METABOLIC PANEL
ALBUMIN: 3.9 g/dL (ref 3.5–5.2)
ALT: 11 U/L (ref 0–35)
AST: 14 U/L (ref 0–37)
Alkaline Phosphatase: 70 U/L (ref 39–117)
BILIRUBIN TOTAL: 0.4 mg/dL (ref 0.2–1.2)
BUN: 12 mg/dL (ref 6–23)
CALCIUM: 9.6 mg/dL (ref 8.4–10.5)
CO2: 32 mEq/L (ref 19–32)
CREATININE: 0.79 mg/dL (ref 0.40–1.20)
Chloride: 102 mEq/L (ref 96–112)
GFR: 99.19 mL/min (ref >60.00)
GLUCOSE: 92 mg/dL (ref 70–99)
POTASSIUM: 3.8 meq/L (ref 3.5–5.1)
Sodium: 138 mEq/L (ref 135–145)
TOTAL PROTEIN: 6.7 g/dL (ref 6.0–8.3)

## 2014-03-21 LAB — IBC PANEL
IRON: 70 ug/dL (ref 42–145)
SATURATION RATIOS: 23.3 % (ref 20.0–50.0)
Transferrin: 215 mg/dL (ref 212.0–360.0)

## 2014-03-21 LAB — CYTOLOGY - PAP

## 2014-03-24 ENCOUNTER — Encounter: Payer: Self-pay | Admitting: Internal Medicine

## 2014-04-17 ENCOUNTER — Encounter: Payer: Self-pay | Admitting: Internal Medicine

## 2014-04-18 ENCOUNTER — Other Ambulatory Visit: Payer: Self-pay | Admitting: *Deleted

## 2014-04-18 MED ORDER — CYCLOBENZAPRINE HCL 10 MG PO TABS: 10.0000 mg | ORAL_TABLET | Freq: Three times a day (TID) | ORAL | Status: DC | PRN

## 2014-06-15 DIAGNOSIS — Z7689 Persons encountering health services in other specified circumstances: Secondary | ICD-10-CM

## 2014-06-18 ENCOUNTER — Encounter: Payer: Self-pay | Admitting: Internal Medicine

## 2014-06-20 ENCOUNTER — Telehealth: Payer: Self-pay | Admitting: Internal Medicine

## 2014-06-20 NOTE — Telephone Encounter (Signed)
Pt dropped off DMV forms to be filled out. Forms in Dr. Melina Schoolsullo's [email or web address]

## 2014-06-23 NOTE — Telephone Encounter (Signed)
In red folder. 

## 2014-06-24 NOTE — Telephone Encounter (Signed)
dMV form completed.  The charge is $50

## 2014-06-24 NOTE — Telephone Encounter (Signed)
Left message for patient that form is ready for pickup. Form placed at front desk copy sent to scan and copy for billing.

## 2014-07-08 ENCOUNTER — Encounter: Payer: Self-pay | Admitting: Internal Medicine

## 2014-07-09 MED ORDER — CYCLOBENZAPRINE HCL 10 MG PO TABS: 10.0000 mg | ORAL_TABLET | Freq: Three times a day (TID) | ORAL | Status: DC | PRN

## 2014-07-09 NOTE — Telephone Encounter (Signed)
Med refilled.

## 2014-07-23 ENCOUNTER — Encounter: Payer: Self-pay | Admitting: Internal Medicine

## 2014-07-24 ENCOUNTER — Encounter: Payer: Self-pay | Admitting: *Deleted

## 2014-10-27 ENCOUNTER — Encounter: Payer: Self-pay | Admitting: Internal Medicine

## 2015-02-23 ENCOUNTER — Ambulatory Visit (INDEPENDENT_AMBULATORY_CARE_PROVIDER_SITE_OTHER): Payer: BC Managed Care – PPO | Admitting: Internal Medicine

## 2015-02-23 ENCOUNTER — Encounter: Payer: Self-pay | Admitting: Internal Medicine

## 2015-02-23 VITALS — BP 126/76 | HR 105 | Temp 98.1°F | Resp 14 | Ht 63.0 in | Wt 210.8 lb

## 2015-02-23 DIAGNOSIS — M7632 Iliotibial band syndrome, left leg: Secondary | ICD-10-CM

## 2015-02-23 DIAGNOSIS — E559 Vitamin D deficiency, unspecified: Secondary | ICD-10-CM

## 2015-02-23 DIAGNOSIS — M5416 Radiculopathy, lumbar region: Secondary | ICD-10-CM | POA: Insufficient documentation

## 2015-02-23 DIAGNOSIS — E785 Hyperlipidemia, unspecified: Secondary | ICD-10-CM

## 2015-02-23 DIAGNOSIS — Z1159 Encounter for screening for other viral diseases: Secondary | ICD-10-CM

## 2015-02-23 DIAGNOSIS — Z0000 Encounter for general adult medical examination without abnormal findings: Secondary | ICD-10-CM

## 2015-02-23 DIAGNOSIS — R5383 Other fatigue: Secondary | ICD-10-CM

## 2015-02-23 DIAGNOSIS — Z800 Family history of malignant neoplasm of digestive organs: Secondary | ICD-10-CM | POA: Insufficient documentation

## 2015-02-23 MED ORDER — MELOXICAM 15 MG PO TABS: 15.0000 mg | ORAL_TABLET | Freq: Every day | ORAL | Status: DC

## 2015-02-23 MED ORDER — PREDNISONE 10 MG PO TABS: ORAL_TABLET | ORAL | Status: DC

## 2015-02-23 NOTE — Patient Instructions (Addendum)
You can try taking 25 mg generic benadryl (dipenhydramine)  For the runny nose,  It can be  Very sedating so take it only at bedtime.  I called in prednisone to take for 6 days in a tapering dose to clear up  Your cough.  You can still use the tylenol sinus if it helps the congestion  Do not start the meloxicam until you finish the prednisoe  Referral to Dr Sharlet Salina for your left leg pain (sounds like Ileotibial band or "IT ' band)  Referral to [facility] for colonoscopy  The  diet I discussed with you today is the 10 day Green Smoothie Cleansing /Detox Diet by Linden Dolin . available on [place] [address] for around $10.  This is not a low carb or a weight loss diet,  It is fundamentally a "cleansing" low fat diet that eliminates sugar, gluten, caffeine, alcohol and dairy for 10 days .  What you add back after the initial ten days is entirely up to  you!  You can expect to lose 5 to 10 lbs depending on how strict you are.   I found that  drinking 2 smoothies or juices  daily and keeping one chewable meal (but keep it simple, like baked fish and salad, rice or bok choy) kept me satisfied and kept me from straying  .  You snack primarily on fresh  fruit, egg whites and judicious quantities of nuts. I  Recommend adding a  vegetable based protein powder  To any smoothie made with almond milk.  (nothing with whey , since whey is dairy) in it.  [employer] has a [employer] officer, political party .   It does require some form of a nutrient extractor (Vita Mix, a electric juicer,  Or a Nutribullet Rx).  using frozen fruits is much more convenient and cost effective. You can even find plenty of organic fruit in the frozen fruit section of [employer].  Just thaw what you need for the following day the night before in the refrigerator (to avoid jamming up your machine)   Menopause is a normal process in which your reproductive ability comes to an end. This process happens gradually over a span of months to years, usually between the ages of 52 and  41. Menopause is complete when you have missed 12 consecutive menstrual periods. It is important to talk with your health care provider about some of the most common conditions that affect postmenopausal women, such as heart disease, cancer, and bone loss (osteoporosis). Adopting a healthy lifestyle and getting preventive care can help to promote your health and wellness. Those actions can also lower your chances of developing some of these common conditions. WHAT SHOULD I KNOW ABOUT MENOPAUSE? During menopause, you may experience a number of symptoms, such as:  Moderate-to-severe hot flashes.  Night sweats.  Decrease in sex drive.  Mood swings.  Headaches.  Tiredness.  Irritability.  Memory problems.  Insomnia. Choosing to treat or not to treat menopausal changes is an individual decision that you make with your health care provider. WHAT SHOULD I KNOW ABOUT HORMONE REPLACEMENT THERAPY AND SUPPLEMENTS? Hormone therapy products are effective for treating symptoms that are associated with menopause, such as hot flashes and night sweats. Hormone replacement carries certain risks, especially as you become older. If you are thinking about using estrogen or estrogen with progestin treatments, discuss the benefits and risks with your health care provider. WHAT SHOULD I KNOW ABOUT HEART DISEASE AND STROKE? Heart disease, heart attack, and stroke become more  likely as you age. This may be due, in part, to the hormonal changes that your body experiences during menopause. These can affect how your body processes dietary fats, triglycerides, and cholesterol. Heart attack and stroke are both medical emergencies. There are many things that you can do to help prevent heart disease and stroke:  Have your blood pressure checked at least every 1-2 years. High blood pressure causes heart disease and increases the risk of stroke.  If you are 66-31 years old, ask your health care provider if you should  take aspirin to prevent a heart attack or a stroke.  Do not use any tobacco products, including cigarettes, chewing tobacco, or electronic cigarettes. If you need help quitting, ask your health care provider.  It is important to eat a healthy diet and maintain a healthy weight.  Be sure to include plenty of vegetables, fruits, low-fat dairy products, and lean protein.  Avoid eating foods that are high in solid fats, added sugars, or salt (sodium).  Get regular exercise. This is one of the most important things that you can do for your health.  Try to exercise for at least 150 minutes each week. The type of exercise that you do should increase your heart rate and make you sweat. This is known as moderate-intensity exercise.  Try to do strengthening exercises at least twice each week. Do these in addition to the moderate-intensity exercise.  Know your numbers.Ask your health care provider to check your cholesterol and your blood glucose. Continue to have your blood tested as directed by your health care provider. WHAT SHOULD I KNOW ABOUT CANCER SCREENING? There are several types of cancer. Take the following steps to reduce your risk and to catch any cancer development as early as possible. Breast Cancer  Practice breast self-awareness.  This means understanding how your breasts normally appear and feel.  It also means doing regular breast self-exams. Let your health care provider know about any changes, no matter how small.  If you are 30 or older, have a clinician do a breast exam (clinical breast exam or CBE) every year. Depending on your age, family history, and medical history, it may be recommended that you also have a yearly breast X-ray (mammogram).  If you have a family history of breast cancer, talk with your health care provider about genetic screening.  If you are at high risk for breast cancer, talk with your health care provider about having an MRI and a mammogram every  year.  Breast cancer (BRCA) gene test is recommended for women who have family members with BRCA-related cancers. Results of the assessment will determine the need for genetic counseling and BRCA1 and for BRCA2 testing. BRCA-related cancers include these types:  Breast. This occurs in males or females.  Ovarian.  Tubal. This may also be called fallopian tube cancer.  Cancer of the abdominal or pelvic lining (peritoneal cancer).  Prostate.  Pancreatic. Cervical, Uterine, and Ovarian Cancer Your health care provider may recommend that you be screened regularly for cancer of the pelvic organs. These include your ovaries, uterus, and vagina. This screening involves a pelvic exam, which includes checking for microscopic changes to the surface of your cervix (Pap test).  For women ages 21-65, health care providers may recommend a pelvic exam and a Pap test every three years. For women ages 78-65, they may recommend the Pap test and pelvic exam, combined with testing for human papilloma virus (HPV), every five years. Some types of HPV increase  your risk of cervical cancer. Testing for HPV may also be done on women of any age who have unclear Pap test results.  Other health care providers may not recommend any screening for nonpregnant women who are considered low risk for pelvic cancer and have no symptoms. Ask your health care provider if a screening pelvic exam is right for you.  If you have had past treatment for cervical cancer or a condition that could lead to cancer, you need Pap tests and screening for cancer for at least 20 years after your treatment. If Pap tests have been discontinued for you, your risk factors (such as having a new sexual partner) need to be reassessed to determine if you should start having screenings again. Some women have medical problems that increase the chance of getting cervical cancer. In these cases, your health care provider may recommend that you have screening  and Pap tests more often.  If you have a family history of uterine cancer or ovarian cancer, talk with your health care provider about genetic screening.  If you have vaginal bleeding after reaching menopause, tell your health care provider.  There are currently no reliable tests available to screen for ovarian cancer. Lung Cancer Lung cancer screening is recommended for adults 38-35 years old who are at high risk for lung cancer because of a history of smoking. A yearly low-dose CT scan of the lungs is recommended if you:  Currently smoke.  Have a history of at least 30 pack-years of smoking and you currently smoke or have quit within the past 15 years. A pack-year is smoking an average of one pack of cigarettes per day for one year. Yearly screening should:  Continue until it has been 15 years since you quit.  Stop if you develop a health problem that would prevent you from having lung cancer treatment. Colorectal Cancer  This type of cancer can be detected and can often be prevented.  Routine colorectal cancer screening usually begins at age 76 and continues through age 70.  If you have risk factors for colon cancer, your health care provider may recommend that you be screened at an earlier age.  If you have a family history of colorectal cancer, talk with your health care provider about genetic screening.  Your health care provider may also recommend using home test kits to check for hidden blood in your stool.  A small camera at the end of a tube can be used to examine your colon directly (sigmoidoscopy or colonoscopy). This is done to check for the earliest forms of colorectal cancer.  Direct examination of the colon should be repeated every 5-10 years until age 26. However, if early forms of precancerous polyps or small growths are found or if you have a family history or genetic risk for colorectal cancer, you may need to be screened more often. Skin Cancer  Check your skin  from head to toe regularly.  Monitor any moles. Be sure to tell your health care provider:  About any new moles or changes in moles, especially if there is a change in a mole's shape or color.  If you have a mole that is larger than the size of a pencil eraser.  If any of your family members has a history of skin cancer, especially at a young age, talk with your health care provider about genetic screening.  Always use sunscreen. Apply sunscreen liberally and repeatedly throughout the day.  Whenever you are outside, protect yourself by  wearing long sleeves, pants, a wide-brimmed hat, and sunglasses. WHAT SHOULD I KNOW ABOUT OSTEOPOROSIS? Osteoporosis is a condition in which bone destruction happens more quickly than new bone creation. After menopause, you may be at an increased risk for osteoporosis. To help prevent osteoporosis or the bone fractures that can happen because of osteoporosis, the following is recommended:  If you are 72-51 years old, get at least 1,000 mg of calcium and at least 600 mg of vitamin D per day.  If you are older than age 61 but younger than age 45, get at least 1,200 mg of calcium and at least 600 mg of vitamin D per day.  If you are older than age 71, get at least 1,200 mg of calcium and at least 800 mg of vitamin D per day. Smoking and excessive alcohol intake increase the risk of osteoporosis. Eat foods that are rich in calcium and vitamin D, and do weight-bearing exercises several times each week as directed by your health care provider. WHAT SHOULD I KNOW ABOUT HOW MENOPAUSE AFFECTS [place]? Depression may occur at any age, but it is more common as you become older. Common symptoms of depression include:  Low or sad mood.  Changes in sleep patterns.  Changes in appetite or eating patterns.  Feeling an overall lack of motivation or enjoyment of activities that you previously enjoyed.  Frequent crying spells. Talk with your health care  provider if you think that you are experiencing depression. WHAT SHOULD I KNOW ABOUT IMMUNIZATIONS? It is important that you get and maintain your immunizations. These include:  Tetanus, diphtheria, and pertussis (Tdap) booster vaccine.  Influenza every year before the flu season begins.  Pneumonia vaccine.  Shingles vaccine. Your health care provider may also recommend other immunizations.   This information is not intended to replace advice given to you by your health care provider. Make sure you discuss any questions you have with your health care provider.   Document Released: 03/25/2005 Document Revised: 02/21/2014 Document Reviewed: 10/03/2013 Elsevier Interactive Patient Education [employer].

## 2015-02-23 NOTE — Assessment & Plan Note (Signed)
Suggested by pain history, although she is not an athlete and does not run, Refer to Dr Yves Dillhasnis as her pain has not resolved and is prevenitn her from exercising

## 2015-02-23 NOTE — Progress Notes (Signed)
Pre-visit discussion using our clinic review tool. No additional management support is needed unless otherwise documented below in the visit note.  

## 2015-02-23 NOTE — Progress Notes (Signed)
Patient ID: Victoria Hardin, female    DOB: 1965/11/10  Age: 50 y.o. MRN: [id number]  The patient is here for annual wellness examination and management of other chronic and acute problems.  Screening colonoscopy due,  FH of colon CA in mother  Annual mammogram due PAP smear normal 2016     The risk factors are reflected in the social history.  The roster of all physicians providing medical care to patient - is listed in the Snapshot section of the chart.   Home safety : The patient has smoke detectors in the home. They wear seatbelts.  There are no firearms at home. There is no violence in the home.   There is no risks for hepatitis, STDs or HIV. There is no   history of blood transfusion. They have no travel history to infectious disease endemic areas of the world.  The patient has seen their dentist in the last six month. They have seen their eye doctor in the last year. They admit to slight hearing difficulty with regard to whispered voices and some television programs.  They have deferred audiologic testing in the last year.  They do not  have excessive sun exposure. Discussed the need for sun protection: hats, long sleeves and use of sunscreen if there is significant sun exposure.   Diet: the importance of a healthy diet is discussed. They do have a healthy diet.  The benefits of regular aerobic exercise were discussed. She is not exercising    Depression screen: there are no signs or vegative symptoms of depression- irritability, change in appetite, anhedonia, sadness/tearfullness.   The following portions of the patient's history were reviewed and updated as appropriate: allergies, current medications, past family history, past medical history,  past surgical history, past social history  and problem list.  Visual acuity was not assessed per patient preference since she has regular follow up with her ophthalmologist. Hearing and body mass index were assessed and reviewed.   During  the course of the visit the patient was educated and counseled about appropriate screening and preventive services including : fall prevention , diabetes screening, nutrition counseling, colorectal cancer screening, and recommended immunizations.    CC: The primary encounter diagnosis was Other fatigue. Diagnoses of Vitamin D deficiency, Hyperlipidemia, Need for hepatitis C screening test, and Family history of colon cancer requiring screening colonoscopy were also pertinent to this visit.   Last seen one year ago    Cough for several weeks.  Took tylenol severe sinus for a few weeks,  Still having clear drainage on the left,  Cough has improved.   Still having left lateral leg pain from hip to side of knee . Did not respond to MR, trial of meloxicam . Hurts worse with lying down and going up stairs.    , but the symptoms in the right leg have resolved.  The foot is not involved and the symptoms are present in the evening and aggravated by lying still and improved with walking.    History Victoria Hardin has a past medical history of Shingles outbreak and History of kidney stones.   She has past surgical history that includes Abdominal hysterectomy.   Her family history includes Cancer in her maternal grandfather; Cancer (age of onset: 2257) in her mother; Heart disease in her maternal grandmother.She reports that she has never smoked. She does not have any smokeless tobacco history on file. She reports that she drinks about 0.6 oz of alcohol per week. Her drug history is  not on file.  Outpatient Prescriptions Prior to Visit  Medication Sig Dispense Refill  . cyclobenzaprine (FLEXERIL) 10 MG tablet Take 1 tablet (10 mg total) by mouth 3 (three) times daily as needed for muscle spasms. 90 tablet 01  . FeAspGl-FeFum-B12-FA-C-Succ Ac (FERREX 28) TABS Take 1 tablet by mouth daily with lunch. 90 tablet -  . losartan-hydrochlorothiazide (HYZAAR) 100-25 MG per tablet Take 1 tablet by mouth daily. 90 tablet 3   . Multiple Vitamins-Minerals (MULTIVITAMIN WITH MINERALS) tablet Take 1 tablet by mouth daily.    . potassium chloride SA (K-DUR,KLOR-CON) 20 MEQ tablet Take 1 tablet (20 mEq total) by mouth daily. 30 tablet 3   No facility-administered medications prior to visit.    Review of Systems   Patient denies headache, fevers, malaise, unintentional weight loss, skin rash, eye pain, sinus congestion and sinus pain, sore throat, dysphagia,  hemoptysis , cough, dyspnea, wheezing, chest pain, palpitations, orthopnea, edema, abdominal pain, nausea, melena, diarrhea, constipation, flank pain, dysuria, hematuria, urinary  Frequency, nocturia, numbness, tingling, seizures,  Focal weakness, Loss of consciousness,  Tremor, insomnia, depression, anxiety, and suicidal ideation.      Objective:  BP 126/76 mmHg  Pulse 105  Temp(Src) 98.1 F (36.7 C) (Oral)  Resp 14  Ht 5\' 3"  (1.6 m)  Wt 210 lb 12 oz (95.596 kg)  BMI 37.34 kg/m2  SpO2 98%  Physical Exam   General appearance: alert, cooperative and appears stated age Head: Normocephalic, without obvious abnormality, atraumatic Eyes: conjunctivae/corneas clear. PERRL, EOM's intact. Fundi benign. Ears: normal TM's and external ear canals both ears Nose: Nares normal. Septum midline. Mucosa normal. No drainage or sinus tenderness. Throat: lips, mucosa, and tongue normal; teeth and gums normal Neck: no adenopathy, no carotid bruit, no JVD, supple, symmetrical, trachea midline and thyroid not enlarged, symmetric, no tenderness/mass/nodules Lungs: clear to auscultation bilaterally Breasts: normal appearance, no masses or tenderness Heart: regular rate and rhythm, S1, S2 normal, no murmur, click, rub or gallop Abdomen: soft, non-tender; bowel sounds normal; no masses,  no organomegaly Extremities: extremities normal, atraumatic, no cyanosis or edema Pulses: 2+ and symmetric Skin: Skin color, texture, turgor normal. No rashes or lesions Neurologic: Alert  and oriented X 3, normal strength and tone. Normal symmetric reflexes. Normal coordination and gait.     Assessment & Plan:   Problem List Items Addressed This Visit    Family history of colon cancer requiring screening colonoscopy    Referral to [facility] for screening colonoscopy. Mother had colon Ca       Hyperlipidemia   Relevant Orders   Lipid panel    Other Visit Diagnoses    Other fatigue    -  Primary    Relevant Orders    Comprehensive metabolic panel    TSH    CBC with Differential/Platelet    Vitamin D deficiency        Relevant Orders    VITAMIN D 25 Hydroxy (Vit-D Deficiency, Fractures)    Need for hepatitis C screening test        Relevant Orders    Hepatitis C antibody       I am having Victoria Hardin start on meloxicam and predniSONE. I am also having her maintain her multivitamin with minerals, losartan-hydrochlorothiazide, FERREX 28, potassium chloride SA, and cyclobenzaprine.  Meds ordered this encounter  Medications  . meloxicam (MOBIC) 15 MG tablet    Sig: Take 1 tablet (15 mg total) by mouth daily.    Dispense:  30 tablet  Refill:  3  . predniSONE (DELTASONE) 10 MG tablet    Sig: 6 tablets on Day 1 , then reduce by 1 tablet daily until gone    Dispense:  21 tablet    Refill:  0    There are no discontinued medications.  Follow-up: Return in about 6 months (around 08/23/2015).   Sherlene Shams, MD

## 2015-02-23 NOTE — Progress Notes (Signed)
Subjective

## 2015-02-23 NOTE — Assessment & Plan Note (Signed)
[facility]. Mother had colon Ca

## 2015-02-27 ENCOUNTER — Other Ambulatory Visit (INDEPENDENT_AMBULATORY_CARE_PROVIDER_SITE_OTHER): Payer: BC Managed Care – PPO

## 2015-02-27 DIAGNOSIS — E559 Vitamin D deficiency, unspecified: Secondary | ICD-10-CM | POA: Diagnosis not present

## 2015-02-27 DIAGNOSIS — Z1159 Encounter for screening for other viral diseases: Secondary | ICD-10-CM

## 2015-02-27 DIAGNOSIS — R5383 Other fatigue: Secondary | ICD-10-CM

## 2015-02-27 DIAGNOSIS — E785 Hyperlipidemia, unspecified: Secondary | ICD-10-CM

## 2015-02-27 LAB — COMPREHENSIVE METABOLIC PANEL
ALT: 12 U/L (ref 0–35)
AST: 16 U/L (ref 0–37)
Albumin: 3.9 g/dL (ref 3.5–5.2)
Alkaline Phosphatase: 74 U/L (ref 39–117)
BILIRUBIN TOTAL: 0.3 mg/dL (ref 0.2–1.2)
BUN: 17 mg/dL (ref 6–23)
CO2: 29 mEq/L (ref 19–32)
Calcium: 9.9 mg/dL (ref 8.4–10.5)
Chloride: 102 mEq/L (ref 96–112)
Creatinine, Ser: 0.86 mg/dL (ref 0.40–1.20)
GFR: 89.59 mL/min (ref >60.00)
Glucose, Bld: 82 mg/dL (ref 70–99)
POTASSIUM: 3.9 meq/L (ref 3.5–5.1)
Sodium: 139 mEq/L (ref 135–145)
TOTAL PROTEIN: 7.6 g/dL (ref 6.0–8.3)

## 2015-02-27 LAB — LIPID PANEL
CHOL/HDL RATIO: 4
Cholesterol: 197 mg/dL (ref 0–200)
HDL: 51.9 mg/dL (ref >39.00)
LDL CALC: 129 mg/dL — AB (ref 0–99)
NonHDL: 144.65
TRIGLYCERIDES: 80 mg/dL (ref 0.0–149.0)
VLDL: 16 mg/dL (ref 0.0–40.0)

## 2015-02-27 LAB — CBC WITH DIFFERENTIAL/PLATELET
BASOS PCT: 0.3 % (ref 0.0–3.0)
Basophils Absolute: 0 10*3/uL (ref 0.0–0.1)
EOS PCT: 0 % (ref 0.0–5.0)
Eosinophils Absolute: 0 10*3/uL (ref 0.0–0.7)
HCT: 38.4 % (ref 36.0–46.0)
HEMOGLOBIN: 12.3 g/dL (ref 12.0–15.0)
LYMPHS PCT: 25 % (ref 12.0–46.0)
Lymphs Abs: 2.4 10*3/uL (ref 0.7–4.0)
MCHC: 32 g/dL (ref 30.0–36.0)
MCV: 84.9 fl (ref 78.0–100.0)
MONOS PCT: 5.1 % (ref 3.0–12.0)
Monocytes Absolute: 0.5 10*3/uL (ref 0.1–1.0)
NEUTROS ABS: 6.8 10*3/uL (ref 1.4–7.7)
Neutrophils Relative %: 69.6 % (ref 43.0–77.0)
PLATELETS: 312 10*3/uL (ref 150.0–400.0)
RBC: 4.52 Mil/uL (ref 3.87–5.11)
RDW: 14.5 % (ref 11.5–15.5)
WBC: 9.7 10*3/uL (ref 4.0–10.5)

## 2015-02-27 LAB — TSH: TSH: 1.52 u[IU]/mL (ref 0.35–4.50)

## 2015-02-27 LAB — VITAMIN D 25 HYDROXY (VIT D DEFICIENCY, FRACTURES): VITD: 20.24 ng/mL — ABNORMAL LOW (ref 30.00–100.00)

## 2015-02-28 LAB — HEPATITIS C ANTIBODY: HCV Ab: NEGATIVE

## 2015-03-01 ENCOUNTER — Encounter: Payer: Self-pay | Admitting: Internal Medicine

## 2015-03-01 ENCOUNTER — Other Ambulatory Visit: Payer: Self-pay | Admitting: Internal Medicine

## 2015-03-01 DIAGNOSIS — E559 Vitamin D deficiency, unspecified: Secondary | ICD-10-CM | POA: Insufficient documentation

## 2015-03-01 MED ORDER — ERGOCALCIFEROL 1.25 MG (50000 UT) PO CAPS: 50000.0000 [IU] | ORAL_CAPSULE | ORAL | Status: DC

## 2015-03-03 DIAGNOSIS — M7632 Iliotibial band syndrome, left leg: Secondary | ICD-10-CM | POA: Insufficient documentation

## 2015-03-09 ENCOUNTER — Encounter: Payer: Self-pay | Admitting: Internal Medicine

## 2015-03-10 MED ORDER — BENZONATATE 200 MG PO CAPS: 200.0000 mg | ORAL_CAPSULE | Freq: Three times a day (TID) | ORAL | Status: DC | PRN

## 2015-03-15 ENCOUNTER — Encounter: Payer: Self-pay | Admitting: Internal Medicine

## 2015-03-16 MED ORDER — LOSARTAN POTASSIUM-HCTZ 100-25 MG PO TABS: 1.0000 | ORAL_TABLET | Freq: Every day | ORAL | Status: DC

## 2015-04-20 ENCOUNTER — Other Ambulatory Visit: Payer: Self-pay | Admitting: Unknown Physician Specialty

## 2015-04-20 ENCOUNTER — Ambulatory Visit
Admission: RE | Admit: 2015-04-20 | Discharge: 2015-04-20 | Disposition: A | Discharge status: Home / Self Care | Payer: BC Managed Care – PPO | Source: Ambulatory Visit | Attending: Unknown Physician Specialty | Admitting: Unknown Physician Specialty

## 2015-04-20 DIAGNOSIS — R05 Cough: Secondary | ICD-10-CM | POA: Diagnosis present

## 2015-04-20 DIAGNOSIS — R059 Cough, unspecified: Secondary | ICD-10-CM

## 2015-08-24 ENCOUNTER — Ambulatory Visit: Payer: BC Managed Care – PPO | Admitting: Internal Medicine

## 2015-09-18 ENCOUNTER — Ambulatory Visit: Payer: BC Managed Care – PPO | Admitting: Internal Medicine

## 2015-10-05 ENCOUNTER — Encounter: Payer: Self-pay | Admitting: Internal Medicine

## 2015-10-05 ENCOUNTER — Ambulatory Visit (INDEPENDENT_AMBULATORY_CARE_PROVIDER_SITE_OTHER): Payer: BC Managed Care – PPO | Admitting: Internal Medicine

## 2015-10-05 VITALS — BP 118/64 | HR 80 | Temp 98.5°F | Resp 14 | Wt 201.8 lb

## 2015-10-05 DIAGNOSIS — E669 Obesity, unspecified: Secondary | ICD-10-CM | POA: Diagnosis not present

## 2015-10-05 DIAGNOSIS — I10 Essential (primary) hypertension: Secondary | ICD-10-CM | POA: Diagnosis not present

## 2015-10-05 NOTE — Progress Notes (Signed)
Subjective:  Patient ID: Victoria Hardin, female    DOB: 08-24-1965  Age: 50 y.o. MRN: [id number]  CC: The primary encounter diagnosis was Essential hypertension. A diagnosis of Obesity (BMI 30-39.9) was also pertinent to this visit.  HPI Victoria Hardin presents for follow up on  Multiple issues:  1) Patient has been taking all medications as directed without experiencing any side effects.  Patient is sleeping well,  Has no specific joint or GI complaints today.   She is exercising regularly and following a diet intended for weight loss.   She has lost 9 lbs since January.  Diet and exercise regimen reviewed in detail.   Outpatient Medications Prior to Visit  Medication Sig Dispense Refill  . ergocalciferol (DRISDOL) 50000 units capsule Take 1 capsule (50,000 Units total) by mouth once a week. 12 capsule 0  . FeAspGl-FeFum-B12-FA-C-Succ Ac (FERREX 28) TABS Take 1 tablet by mouth daily with lunch. 90 tablet -  . losartan-hydrochlorothiazide (HYZAAR) 100-25 MG tablet Take 1 tablet by mouth daily. 90 tablet 3  . Multiple Vitamins-Minerals (MULTIVITAMIN WITH MINERALS) tablet Take 1 tablet by mouth daily.    . potassium chloride SA (K-DUR,KLOR-CON) 20 MEQ tablet Take 1 tablet (20 mEq total) by mouth daily. 30 tablet 3  . benzonatate (TESSALON) 200 MG capsule Take 1 capsule (200 mg total) by mouth 3 (three) times daily as needed for cough. 60 capsule 1  . cyclobenzaprine (FLEXERIL) 10 MG tablet Take 1 tablet (10 mg total) by mouth 3 (three) times daily as needed for muscle spasms. 90 tablet 01  . meloxicam (MOBIC) 15 MG tablet Take 1 tablet (15 mg total) by mouth daily. 30 tablet 3  . predniSONE (DELTASONE) 10 MG tablet 6 tablets on Day 1 , then reduce by 1 tablet daily until gone 21 tablet 0   No facility-administered medications prior to visit.     Review of Systems;  Patient denies headache, fevers, malaise, unintentional weight loss, skin rash, eye pain, sinus congestion and sinus pain,  sore throat, dysphagia,  hemoptysis , cough, dyspnea, wheezing, chest pain, palpitations, orthopnea, edema, abdominal pain, nausea, melena, diarrhea, constipation, flank pain, dysuria, hematuria, urinary  Frequency, nocturia, numbness, tingling, seizures,  Focal weakness, Loss of consciousness,  Tremor, insomnia, depression, anxiety, and suicidal ideation.      Objective:  BP 118/64   Pulse 80   Temp 98.5 F (36.9 C) (Oral)   Resp 14   Wt 201 lb 12.8 oz (91.5 kg)   SpO2 98%   BMI 35.75 kg/m   BP Readings from Last 3 Encounters:  10/05/15 118/64  02/23/15 126/76  03/17/14 118/84    Wt Readings from Last 3 Encounters:  10/05/15 201 lb 12.8 oz (91.5 kg)  02/23/15 210 lb 12 oz (95.6 kg)  03/17/14 207 lb 8 oz (94.1 kg)    General appearance: alert, cooperative and appears stated age Ears: normal TM's and external ear canals both ears Throat: lips, mucosa, and tongue normal; teeth and gums normal Neck: no adenopathy, no carotid bruit, supple, symmetrical, trachea midline and thyroid not enlarged, symmetric, no tenderness/mass/nodules Back: symmetric, no curvature. ROM normal. No CVA tenderness. Lungs: clear to auscultation bilaterally Heart: regular rate and rhythm, S1, S2 normal, no murmur, click, rub or gallop Abdomen: soft, non-tender; bowel sounds normal; no masses,  no organomegaly Pulses: 2+ and symmetric Skin: Skin color, texture, turgor normal. No rashes or lesions Lymph nodes: Cervical, supraclavicular, and axillary nodes normal.  No results found for: HGBA1C  Lab  Results  Component Value Date   CREATININE 0.88 10/05/2015   CREATININE 0.86 02/27/2015   CREATININE 0.79 03/21/2014    Lab Results  Component Value Date   WBC 9.7 02/27/2015   HGB 12.3 02/27/2015   HCT 38.4 02/27/2015   PLT 312.0 02/27/2015   GLUCOSE 84 10/05/2015   CHOL 197 02/27/2015   TRIG 80.0 02/27/2015   HDL 51.90 02/27/2015   LDLDIRECT 160.0 01/14/2013   LDLCALC 129 (H) 02/27/2015   ALT  11 10/05/2015   AST 16 10/05/2015   NA 138 10/05/2015   K 3.8 10/05/2015   CL 102 10/05/2015   CREATININE 0.88 10/05/2015   BUN 12 10/05/2015   CO2 30 10/05/2015   TSH 1.52 02/27/2015   MICROALBUR 1.3 03/26/2013    Dg Chest 2 View  Result Date: 04/20/2015 CLINICAL DATA:  Cough. EXAM: CHEST  2 VIEW COMPARISON:  None. FINDINGS: Mediastinum and hilar structures normal. Lungs are clear. Heart size normal. No pleural effusion or pneumothorax. Mild scoliosis thoracic spine . IMPRESSION: No acute cardiopulmonary disease. Electronically Signed   By: Maisie Fushomas  Register   On: 04/20/2015 16:29    Assessment & Plan:   Problem List Items Addressed This Visit    Obesity (BMI 30-39.9)    I have congratulated her in reduction of   BMI and encouraged  Continued weight loss with goal of 10% of body weigh over the next 6 months using a low glycemic index diet and regular exercise a minimum of 5 days per week.        Hypertension - Primary    Well controlled on current regimen. Renal function stable, no changes today.  Lab Results  Component Value Date   CREATININE 0.88 10/05/2015   Lab Results  Component Value Date   NA 138 10/05/2015   K 3.8 10/05/2015   CL 102 10/05/2015   CO2 30 10/05/2015         Relevant Orders   Comprehensive metabolic panel (Completed)    Other Visit Diagnoses   None.    A total of 25 minutes of face to face time was spent with patient more than half of which was spent in counselling about the above mentioned conditions  and coordination of care  I have discontinued Ms. Vaca's cyclobenzaprine, meloxicam, predniSONE, and benzonatate. I am also having her maintain her multivitamin with minerals, FERREX 28, potassium chloride SA, ergocalciferol, and losartan-hydrochlorothiazide.  No orders of the defined types were placed in this encounter.   Medications Discontinued During This Encounter  Medication Reason  . benzonatate (TESSALON) 200 MG capsule Error  .  cyclobenzaprine (FLEXERIL) 10 MG tablet Error  . meloxicam (MOBIC) 15 MG tablet Error  . predniSONE (DELTASONE) 10 MG tablet Error    Follow-up: No Follow-up on file.   Sherlene ShamsULLO, Yarden Hillis L, MD

## 2015-10-05 NOTE — Progress Notes (Signed)
Pre visit review using our clinic review tool, if applicable. No additional management support is needed unless otherwise documented below in the visit note. 

## 2015-10-05 NOTE — Patient Instructions (Signed)
You are doing well!  Great work on the [employer]weight  Don't forget to start exercising  30 mintues 5 days per week   See you in 6 months

## 2015-10-06 LAB — COMPREHENSIVE METABOLIC PANEL
ALT: 11 U/L (ref 0–35)
AST: 16 U/L (ref 0–37)
Albumin: 4.3 g/dL (ref 3.5–5.2)
Alkaline Phosphatase: 79 U/L (ref 39–117)
BUN: 12 mg/dL (ref 6–23)
CALCIUM: 9.4 mg/dL (ref 8.4–10.5)
CHLORIDE: 102 meq/L (ref 96–112)
CO2: 30 mEq/L (ref 19–32)
CREATININE: 0.88 mg/dL (ref 0.40–1.20)
GFR: 87.04 mL/min (ref >60.00)
Glucose, Bld: 84 mg/dL (ref 70–99)
Potassium: 3.8 mEq/L (ref 3.5–5.1)
Sodium: 138 mEq/L (ref 135–145)
Total Bilirubin: 0.3 mg/dL (ref 0.2–1.2)
Total Protein: 7.7 g/dL (ref 6.0–8.3)

## 2015-10-07 NOTE — Assessment & Plan Note (Signed)
I have congratulated her in reduction of   BMI and encouraged  Continued weight loss with goal of 10% of body weigh over the next 6 months using a low glycemic index diet and regular exercise a minimum of 5 days per week.    

## 2015-10-07 NOTE — Assessment & Plan Note (Signed)
Well controlled on current regimen. Renal function stable, no changes today.  Lab Results  Component Value Date   CREATININE 0.88 10/05/2015   Lab Results  Component Value Date   NA 138 10/05/2015   K 3.8 10/05/2015   CL 102 10/05/2015   CO2 30 10/05/2015

## 2016-02-02 ENCOUNTER — Encounter: Payer: Self-pay | Admitting: Internal Medicine

## 2016-02-03 ENCOUNTER — Ambulatory Visit (INDEPENDENT_AMBULATORY_CARE_PROVIDER_SITE_OTHER): Payer: BC Managed Care – PPO | Admitting: Family Medicine

## 2016-02-03 ENCOUNTER — Encounter: Payer: Self-pay | Admitting: Family Medicine

## 2016-02-03 DIAGNOSIS — J0100 Acute maxillary sinusitis, unspecified: Secondary | ICD-10-CM | POA: Diagnosis not present

## 2016-02-03 MED ORDER — HYDROCODONE-HOMATROPINE 5-1.5 MG/5ML PO SYRP: 5.0000 mL | ORAL_SOLUTION | Freq: Three times a day (TID) | ORAL | 0 refills | Status: DC | PRN

## 2016-02-03 MED ORDER — AMOXICILLIN-POT CLAVULANATE 875-125 MG PO TABS: 1.0000 | ORAL_TABLET | Freq: Two times a day (BID) | ORAL | 0 refills | Status: DC

## 2016-02-03 NOTE — Progress Notes (Signed)
  Marikay AlarEric Akeen Ledyard, MD Phone: [phone]  Victoria PughFelecia Hardin is a 50 y.o. female who presents today for same-day visit.  Patient notes 2 weeks of cough and congestion. Notes sinus congestion in her maxillary sinuses. Notes clear mucus out of her nose. Cough productive of clear phlegm. Notes some ear congestion. No fevers. Notes some sick contacts at school. No benefit with NyQuil and Alka-Seltzer cold and flu.  ROS see history of present illness  Objective  Physical Exam Vitals:   02/03/16 0955 02/03/16 1004  BP: (!) 142/96 130/90  Pulse: 81   Temp: 98.9 F (37.2 C)     BP Readings from Last 3 Encounters:  02/03/16 130/90  10/05/15 118/64  02/23/15 126/76   Wt Readings from Last 3 Encounters:  02/03/16 199 lb 6.4 oz (90.4 kg)  10/05/15 201 lb 12.8 oz (91.5 kg)  02/23/15 210 lb 12 oz (95.6 kg)    Physical Exam  Constitutional: No distress.  HENT:  Head: Normocephalic and atraumatic.  Mouth/Throat: Oropharynx is clear and moist. No oropharyngeal exudate.  Normal TMs bilaterally  Eyes: Conjunctivae are normal. Pupils are equal, round, and reactive to light.  Neck: Neck supple.  Cardiovascular: Normal rate, regular rhythm and normal heart sounds.   Pulmonary/Chest: Effort normal and breath sounds normal.  Lymphadenopathy:    She has no cervical adenopathy.  Skin: She is not diaphoretic.     Assessment/Plan: Please see individual problem list.  Sinusitis, acute maxillary Patient's duration of symptoms and lack of improvement likely indicate bacterial sinusitis. We will treat with Augmentin. Hycodan for cough. She'll continue to monitor. Given return precautions.   No orders of the defined types were placed in this encounter.   Meds ordered this encounter  Medications  . amoxicillin-clavulanate (AUGMENTIN) 875-125 MG tablet    Sig: Take 1 tablet by mouth 2 (two) times daily.    Dispense:  14 tablet    Refill:  0  . HYDROcodone-homatropine (HYCODAN) 5-1.5 MG/5ML  syrup    Sig: Take 5 mLs by mouth every 8 (eight) hours as needed for cough.    Dispense:  120 mL    Refill:  0    Marikay AlarEric Jovahn Breit, MD [facility] [place]

## 2016-02-03 NOTE — Progress Notes (Signed)
Pre visit review using our clinic review tool, if applicable. No additional management support is needed unless otherwise documented below in the visit note. 

## 2016-02-03 NOTE — Assessment & Plan Note (Signed)
Patient's duration of symptoms and lack of improvement likely indicate bacterial sinusitis. We will treat with Augmentin. Hycodan for cough. She'll continue to monitor. Given return precautions.

## 2016-02-03 NOTE — Patient Instructions (Signed)
Nice to see you. Your symptoms are likely related to a sinus infection. We will treat you with Augmentin for this. You can use the Hycodan for cough. Please be wary as this may make you drowsy. Please do not drive or operate any machinery if this does make you drowsy. If you develop cough productive of blood, fevers, or any new or changing symptoms please seek medical attention immediately.

## 2016-02-17 ENCOUNTER — Encounter: Payer: Self-pay | Admitting: Internal Medicine

## 2016-02-18 ENCOUNTER — Telehealth: Payer: Self-pay | Admitting: Internal Medicine

## 2016-02-18 MED ORDER — PREDNISONE 10 MG PO TABS: ORAL_TABLET | ORAL | 0 refills | Status: DC

## 2016-02-18 MED ORDER — BENZONATATE 200 MG PO CAPS: 200.0000 mg | ORAL_CAPSULE | Freq: Three times a day (TID) | ORAL | 1 refills | Status: DC | PRN

## 2016-02-18 NOTE — Telephone Encounter (Signed)
Patient notified and voiced understanding.

## 2016-02-18 NOTE — Telephone Encounter (Signed)
I SENT A PREDNISONE TAPER AND TESSALON PERLES TO USE AS NEEDED FOR COUGH .  TELL HER TO TRY ADDING BENADRYL 25  30 MINUTES BEFORE BED.  THIS WILL HELP DRY UP THE POST NASAL DRIP

## 2016-02-18 NOTE — Telephone Encounter (Signed)
Patient had sent My chart message requesting something for cough, patient is afebrile , has clear drainage running down back of throat . Patient was seen on 02/03/16 by Dr. Birdie SonsSonnenberg and treated for acute sinusitis wit Augmentin and with  Hycodan for cough. There are no appointments available, unless we use 4.30 on Friday.

## 2016-02-18 NOTE — Telephone Encounter (Signed)
Pt called returning your call. Thank you!  [phone]

## 2016-02-18 NOTE — Telephone Encounter (Signed)
Called patient see phone note dated 02/18/16.

## 2016-02-18 NOTE — Telephone Encounter (Signed)
Left message for patient to return call to office. 

## 2016-03-28 ENCOUNTER — Encounter: Payer: Self-pay | Admitting: Internal Medicine

## 2016-03-30 ENCOUNTER — Other Ambulatory Visit: Payer: Self-pay | Admitting: Internal Medicine

## 2016-03-30 DIAGNOSIS — J310 Chronic rhinitis: Secondary | ICD-10-CM

## 2016-06-01 ENCOUNTER — Encounter: Payer: Self-pay | Admitting: Internal Medicine

## 2016-06-06 ENCOUNTER — Ambulatory Visit (INDEPENDENT_AMBULATORY_CARE_PROVIDER_SITE_OTHER): Payer: BC Managed Care – PPO | Admitting: Internal Medicine

## 2016-06-06 ENCOUNTER — Encounter: Payer: Self-pay | Admitting: Internal Medicine

## 2016-06-06 VITALS — BP 142/100 | HR 86 | Temp 98.1°F | Resp 16 | Ht 63.0 in | Wt 206.1 lb

## 2016-06-06 DIAGNOSIS — E611 Iron deficiency: Secondary | ICD-10-CM | POA: Diagnosis not present

## 2016-06-06 DIAGNOSIS — N951 Menopausal and female climacteric states: Secondary | ICD-10-CM | POA: Diagnosis not present

## 2016-06-06 DIAGNOSIS — I10 Essential (primary) hypertension: Secondary | ICD-10-CM | POA: Diagnosis not present

## 2016-06-06 DIAGNOSIS — R7303 Prediabetes: Secondary | ICD-10-CM | POA: Diagnosis not present

## 2016-06-06 DIAGNOSIS — Z124 Encounter for screening for malignant neoplasm of cervix: Secondary | ICD-10-CM

## 2016-06-06 DIAGNOSIS — R7301 Impaired fasting glucose: Secondary | ICD-10-CM

## 2016-06-06 DIAGNOSIS — Z800 Family history of malignant neoplasm of digestive organs: Secondary | ICD-10-CM

## 2016-06-06 DIAGNOSIS — Z1231 Encounter for screening mammogram for malignant neoplasm of breast: Secondary | ICD-10-CM | POA: Diagnosis not present

## 2016-06-06 DIAGNOSIS — Z862 Personal history of diseases of the blood and blood-forming organs and certain disorders involving the immune mechanism: Secondary | ICD-10-CM | POA: Diagnosis not present

## 2016-06-06 DIAGNOSIS — E669 Obesity, unspecified: Secondary | ICD-10-CM

## 2016-06-06 DIAGNOSIS — E559 Vitamin D deficiency, unspecified: Secondary | ICD-10-CM

## 2016-06-06 DIAGNOSIS — E7800 Pure hypercholesterolemia, unspecified: Secondary | ICD-10-CM

## 2016-06-06 DIAGNOSIS — Z1239 Encounter for other screening for malignant neoplasm of breast: Secondary | ICD-10-CM

## 2016-06-06 LAB — COMPREHENSIVE METABOLIC PANEL
ALT: 21 U/L (ref 0–35)
AST: 21 U/L (ref 0–37)
Albumin: 4.1 g/dL (ref 3.5–5.2)
Alkaline Phosphatase: 87 U/L (ref 39–117)
BILIRUBIN TOTAL: 0.3 mg/dL (ref 0.2–1.2)
BUN: 16 mg/dL (ref 6–23)
CALCIUM: 9.6 mg/dL (ref 8.4–10.5)
CO2: 30 mEq/L (ref 19–32)
CREATININE: 0.95 mg/dL (ref 0.40–1.20)
Chloride: 105 mEq/L (ref 96–112)
GFR: 79.47 mL/min (ref >60.00)
Glucose, Bld: 93 mg/dL (ref 70–99)
Potassium: 4.1 mEq/L (ref 3.5–5.1)
Sodium: 141 mEq/L (ref 135–145)
Total Protein: 7.5 g/dL (ref 6.0–8.3)

## 2016-06-06 LAB — CBC WITH DIFFERENTIAL/PLATELET
BASOS ABS: 0 10*3/uL (ref 0.0–0.1)
Basophils Relative: 0.5 % (ref 0.0–3.0)
EOS ABS: 0.1 10*3/uL (ref 0.0–0.7)
Eosinophils Relative: 2.3 % (ref 0.0–5.0)
HCT: 38.3 % (ref 36.0–46.0)
HEMOGLOBIN: 12.2 g/dL (ref 12.0–15.0)
LYMPHS ABS: 2 10*3/uL (ref 0.7–4.0)
Lymphocytes Relative: 35.4 % (ref 12.0–46.0)
MCHC: 31.8 g/dL (ref 30.0–36.0)
MCV: 84.2 fl (ref 78.0–100.0)
MONO ABS: 0.4 10*3/uL (ref 0.1–1.0)
Monocytes Relative: 7.2 % (ref 3.0–12.0)
NEUTROS PCT: 54.6 % (ref 43.0–77.0)
Neutro Abs: 3 10*3/uL (ref 1.4–7.7)
Platelets: 213 10*3/uL (ref 150.0–400.0)
RBC: 4.55 Mil/uL (ref 3.87–5.11)
RDW: 15 % (ref 11.5–15.5)
WBC: 5.6 10*3/uL (ref 4.0–10.5)

## 2016-06-06 LAB — HEMOGLOBIN A1C: HEMOGLOBIN A1C: 6.1 % (ref 4.6–6.5)

## 2016-06-06 LAB — VITAMIN D 25 HYDROXY (VIT D DEFICIENCY, FRACTURES): VITD: 33.19 ng/mL (ref 30.00–100.00)

## 2016-06-06 LAB — LDL CHOLESTEROL, DIRECT: LDL DIRECT: 130 mg/dL

## 2016-06-06 LAB — TSH: TSH: 0.99 u[IU]/mL (ref 0.35–4.50)

## 2016-06-06 MED ORDER — LOSARTAN POTASSIUM-HCTZ 100-25 MG PO TABS: 1.0000 | ORAL_TABLET | Freq: Every day | ORAL | 3 refills | Status: DC

## 2016-06-06 MED ORDER — ESCITALOPRAM OXALATE 5 MG PO TABS: 5.0000 mg | ORAL_TABLET | Freq: Every day | ORAL | 5 refills | Status: DC

## 2016-06-06 NOTE — Progress Notes (Signed)
Pre visit review using our clinic review tool, if applicable. No additional management support is needed unless otherwise documented below in the visit note. 

## 2016-06-06 NOTE — Patient Instructions (Addendum)
I AGREE with your desire to lose weight,  Currently, your Body mass index is 36.51 kg/m. Your ultimate goal is 170 lbs , to get BMI < 30  Here are a few ways to reduce the carbohydrates in your dietr:  You might want to try a premixed protein drink called Premier Protein shake for breakfast or late night snack . It is great tasting,   very low sugar and available of < $2 serving at [facility] and  In bulk for $1.50/serving at [employer] and [employer]  .    Nutritional analysis :  160 cal  30 g protein  1 g sugar 50% calcium needs   Nicolette Bang and [employer]   There are plenty of high protein low carb cookies,  But they're not called "cookies."  Look for them in the diet section  where the protein shakes are  Sold.   All of these have 5 g sugar or less : Power crunch Atkins bars KIND :thE  "low glycemic index"  variety QUEST : (taste better after being microwaved OUT OF THE WRAPPER)   Regarding your menopause symptoms;  Trial of lexapro 5 mg daily for treatment of menopause    Menopause Menopause is the normal time of life when menstrual periods stop completely. Menopause is complete when you have missed 12 consecutive menstrual periods. It usually occurs between the ages of 52 years and 55 years. Very rarely does a woman develop menopause before the age of 40 years. At menopause, your ovaries stop producing the female hormones estrogen and progesterone. This can cause undesirable symptoms and also affect your health. Sometimes the symptoms may occur 4-5 years before the menopause begins. There is no relationship between menopause and:  Oral contraceptives.  Number of children you had.  Race.  The age your menstrual periods started (menarche). Heavy smokers and very thin women may develop menopause earlier in life. What are the causes?  The ovaries stop producing the female hormones estrogen and progesterone. Other causes include:  Surgery to remove both ovaries.  The ovaries stop  functioning for no known reason.  Tumors of the pituitary gland in the brain.  Medical disease that affects the ovaries and hormone production.  Radiation treatment to the abdomen or pelvis.  Chemotherapy that affects the ovaries. What are the signs or symptoms?  Hot flashes.  Night sweats.  Decrease in sex drive.  Vaginal dryness and thinning of the vagina causing painful intercourse.  Dryness of the skin and developing wrinkles.  Headaches.  Tiredness.  Irritability.  Memory problems.  Weight gain.  Bladder infections.  Hair growth of the face and chest.  Infertility. More serious symptoms include:  Loss of bone (osteoporosis) causing breaks (fractures).  Depression.  Hardening and narrowing of the arteries (atherosclerosis) causing heart attacks and strokes. How is this diagnosed?  When the menstrual periods have stopped for 12 straight months.  Physical exam.  Hormone studies of the blood. How is this treated? There are many treatment choices and nearly as many questions about them. The decisions to treat or not to treat menopausal changes is an individual choice made with your health care provider. Your health care provider can discuss the treatments with you. Together, you can decide which treatment will work best for you. Your treatment choices may include:  Hormone therapy (estrogen and progesterone).  Non-hormonal medicines.  Treating the individual symptoms with medicine (for example antidepressants for depression).  Herbal medicines that may help specific symptoms.  Counseling  by a psychiatrist or psychologist.  Group therapy.  Lifestyle changes including:  Eating healthy.  Regular exercise.  Limiting caffeine and alcohol.  Stress management and meditation.  No treatment. Follow these instructions at home:  Take the medicine your health care provider gives you as directed.  Get plenty of sleep and rest.  Exercise  regularly.  Eat a diet that contains calcium (good for the bones) and soy products (acts like estrogen hormone).  Avoid alcoholic beverages.  Do not smoke.  If you have hot flashes, dress in layers.  Take supplements, calcium, and vitamin D to strengthen bones.  You can use over-the-counter lubricants or moisturizers for vaginal dryness.  Group therapy is sometimes very helpful.  Acupuncture may be helpful in some cases. Contact a health care provider if:  You are not sure you are in menopause.  You are having menopausal symptoms and need advice and treatment.  You are still having menstrual periods after age 52 years.  You have pain with intercourse.  Menopause is complete (no menstrual period for 12 months) and you develop vaginal bleeding.  You need a referral to a specialist (gynecologist, psychiatrist, or psychologist) for treatment. Get help right away if:  You have severe depression.  You have excessive vaginal bleeding.  You fell and think you have a broken bone.  You have pain when you urinate.  You develop leg or chest pain.  You have a fast pounding heart beat (palpitations).  You have severe headaches.  You develop vision problems.  You feel a lump in your breast.  You have abdominal pain or severe indigestion. This information is not intended to replace advice given to you by your health care provider. Make sure you discuss any questions you have with your health care provider. Document Released: 04/23/2003 Document Revised: 07/09/2015 Document Reviewed: 08/30/2012 Elsevier Interactive Patient Education  2017 Elsevier Inc.    Menopause and Hormone Replacement Therapy What is hormone replacement therapy? Hormone replacement therapy (HRT) is the use of artificial (synthetic) hormones to replace hormones that your body stops producing during menopause. Menopause is the normal time of life when menstrual periods stop completely and the ovaries  stop producing the female hormones estrogen and progesterone. This lack of hormones can affect your health and cause undesirable symptoms. HRT can relieve some of those symptoms. What are my options for HRT? HRT may consist of the synthetic hormones estrogen and progestin, or it may consist of only estrogen (estrogen-only therapy). You and your health care provider will decide which form of HRT is best for you. If you choose to be on HRT and you have a uterus, estrogen and progestin are usually prescribed. Estrogen-only therapy is used for women who do not have a uterus. Possible options for taking HRT include:  Pills.  Patches.  Gels.  Sprays.  Vaginal cream.  Vaginal rings.  Vaginal inserts. The amount of hormone(s) that you take and how long you take the hormone(s) varies depending on your individual health. It is important to:  Begin HRT with the lowest possible dosage.  Stop HRT as soon as your health care provider tells you to stop.  Work with your health care provider so that you feel informed and comfortable with your decisions. What are the benefits of HRT? HRT can reduce the frequency and severity of menopausal symptoms. Benefits of HRT vary depending on the menopausal symptoms that you have, the severity of your symptoms, and your overall health. HRT may help to improve the  following menopausal symptoms:  Hot flashes and night sweats. These are sudden feelings of heat that spread over the face and body. The skin may turn red, like a blush. Night sweats are hot flashes that happen while you are sleeping or trying to sleep.  Bone loss (osteoporosis). The body loses calcium more quickly after menopause, causing the bones to become weaker. This can increase the risk for bone breaks (fractures).  Vaginal dryness. The lining of the vagina can become thin and dry, which can cause pain during sexual intercourse or cause infection, burning, or itching.  Urinary tract  infections.  Urinary incontinence. This is a decreased ability to control when you urinate.  Irritability.  Short-term memory problems. What are the risks of HRT? Risks of HRT vary depending on your individual health and medical history. Risks of HRT also depend on whether you receive both estrogen and progestin or you receive estrogen only.HRT may increase the risk of:  Spotting. This is when a small amount of bloodleaks from the vagina unexpectedly.  Endometrial cancer. This cancer is in the lining of the uterus (endometrium).  Breast cancer.  Increased density of breast tissue. This can make it harder to find breast cancer on a breast X-ray (mammogram).  Stroke.  Heart attack.  Blood clots.  Gallbladder disease. Risks of HRT can increase if you have any of the following conditions:  Endometrial cancer.  Liver disease.  Heart disease.  Breast cancer.  History of blood clots.  History of stroke. How should I care for myself while I am on HRT?  Take over-the-counter and prescription medicines only as told by your health care provider.  Get mammograms, pelvic exams, and medical checkups as often as told by your health care provider.  Have Pap tests done as often as told by your health care provider. A Pap test is sometimes called a Pap smear. It is a screening test that is used to check for signs of cancer of the cervix and vagina. A Pap test can also identify the presence of infection or precancerous changes. Pap tests may be done:  Every 3 years, starting at age 68.  Every 5 years, starting after age 27, in combination with testing for human papillomavirus (HPV).  More often or less often depending on other medical conditions you have, your age, and other risk factors.  It is your responsibility to get your Pap test results. Ask your health care provider or the department performing the test when your results will be ready.  Keep all follow-up visits as told  by your health care provider. This is important. When should I seek medical care? Talk with your health care provider if:  You have any of these:  Pain or swelling in your legs.  Shortness of breath.  Chest pain.  Lumps or changes in your breasts or armpits.  Slurred speech.  Pain, burning, or bleeding when you urine.  You develop any of these:  Unusual vaginal bleeding.  Dizziness or headaches.  Weakness or numbness in any part of your arms or legs.  Pain in your abdomen. This information is not intended to replace advice given to you by your health care provider. Make sure you discuss any questions you have with your health care provider. Document Released: 10/30/2002 Document Revised: 12/29/2015 Document Reviewed: 08/04/2014 Elsevier Interactive Patient Education  2017 [employer].

## 2016-06-06 NOTE — Progress Notes (Signed)
Patient ID: Victoria Hardin, female    DOB: 06-01-1965  Age: 51 y.o. MRN: [id number]  The patient is here for annual reventive  examination and management of other chronic and acute problems.   The risk factors are reflected in the social history.  The roster of all physicians providing medical care to patient - is listed in the Snapshot section of the chart.   Home safety : The patient has smoke detectors in the home. They wear seatbelts.  There are no firearms at home. There is no violence in the home.   There is no risks for hepatitis, STDs or HIV. There is no   history of blood transfusion. They have no travel history to infectious disease endemic areas of the world.  The patient has seen their dentist in the last six month. They have seen their eye doctor in the last year.  Discussed the need for sun protection: hats, long sleeves and use of sunscreen if there is significant sun exposure.   Diet: the importance of a healthy diet is discussed. They do have a healthy diet.  The benefits of regular aerobic exercise were discussed. She is not exercising .   Depression screen: there are no signs or vegative symptoms of depression- irritability, change in appetite, anhedonia, sadness/tearfullness.  The following portions of the patient's history were reviewed and updated as appropriate: allergies, current medications, past family history, past medical history,  past surgical history, past social history  and problem list.  Visual acuity was not assessed per patient preference since she has regular follow up with her ophthalmologist. Hearing and body mass index were assessed and reviewed.   During the course of the visit the patient was educated and counseled about appropriate screening and preventive services including : fall prevention , diabetes screening, nutrition counseling, colorectal cancer screening, and recommended immunizations.    CC: The primary encounter diagnosis was Screening  breast examination. Diagnoses of Hot flushes, perimenopausal, Essential hypertension, Pure hypercholesterolemia, Vitamin D deficiency, Impaired fasting blood sugar, Breast cancer screening, Obesity (BMI 35.0-39.9 without comorbidity), History of iron deficiency anemia, Cervical cancer screening, Obesity (BMI 30-39.9), Prediabetes, Iron deficiency, and Family history of colon cancer requiring screening colonoscopy were also pertinent to this visit.  History Victoria Hardin has a past medical history of History of kidney stones and Shingles outbreak.   She has a past surgical history that includes Abdominal hysterectomy.   Her family history includes Cancer in her maternal grandfather; Cancer (age of onset: 49) in her mother; Heart disease in her maternal grandmother.She reports that she has never smoked. She has never used smokeless tobacco. She reports that she drinks about 0.6 oz of alcohol per week . Her drug history is not on file.  Outpatient Medications Prior to Visit  Medication Sig Dispense Refill  . ergocalciferol (DRISDOL) 50000 units capsule Take 1 capsule (50,000 Units total) by mouth once a week. 12 capsule 0  . Multiple Vitamins-Minerals (MULTIVITAMIN WITH MINERALS) tablet Take 1 tablet by mouth daily.    Marland Kitchen losartan-hydrochlorothiazide (HYZAAR) 100-25 MG tablet Take 1 tablet by mouth daily. 90 tablet 3  . amoxicillin-clavulanate (AUGMENTIN) 875-125 MG tablet Take 1 tablet by mouth 2 (two) times daily. (Patient not taking: Reported on 06/06/2016) 14 tablet 0  . benzonatate (TESSALON) 200 MG capsule Take 1 capsule (200 mg total) by mouth 3 (three) times daily as needed for cough. (Patient not taking: Reported on 06/06/2016) 60 capsule 1  . FeAspGl-FeFum-B12-FA-C-Succ Ac (FERREX 28) TABS Take 1 tablet by  mouth daily with lunch. (Patient not taking: Reported on 06/06/2016) 90 tablet -  . HYDROcodone-homatropine (HYCODAN) 5-1.5 MG/5ML syrup Take 5 mLs by mouth every 8 (eight) hours as needed for cough.  (Patient not taking: Reported on 06/06/2016) 120 mL 0  . potassium chloride SA (K-DUR,KLOR-CON) 20 MEQ tablet Take 1 tablet (20 mEq total) by mouth daily. (Patient not taking: Reported on 06/06/2016) 30 tablet 3  . predniSONE (DELTASONE) 10 MG tablet 6 tablets on Day 1 , then reduce by 1 tablet daily until gone (Patient not taking: Reported on 06/06/2016) 21 tablet 0   No facility-administered medications prior to visit.     Review of Systems   Patient denies headache, fevers, malaise, unintentional weight loss, skin rash, eye pain, sinus congestion and sinus pain, sore throat, dysphagia,  hemoptysis , cough, dyspnea, wheezing, chest pain, palpitations, orthopnea, edema, abdominal pain, nausea, melena, diarrhea, constipation, flank pain, dysuria, hematuria, urinary  Frequency, nocturia, numbness, tingling, seizures,  Focal weakness, Loss of consciousness,  Tremor, insomnia, depression, anxiety, and suicidal ideation.      Objective:  BP (!) 142/100   Pulse 86   Temp 98.1 F (36.7 C) (Oral)   Resp 16   Ht  (1.6 m)   Wt 206 lb 1.9 oz (93.5 kg)   SpO2 97%   BMI 36.51 kg/m   Physical Exam   General appearance: alert, cooperative and appears stated age Head: Normocephalic, without obvious abnormality, atraumatic Eyes: conjunctivae/corneas clear. PERRL, EOM's intact. Fundi benign. Ears: normal TM's and external ear canals both ears Nose: Nares normal. Septum midline. Mucosa normal. No drainage or sinus tenderness. Throat: lips, mucosa, and tongue normal; teeth and gums normal Neck: no adenopathy, no carotid bruit, no JVD, supple, symmetrical, trachea midline and thyroid not enlarged, symmetric, no tenderness/mass/nodules Lungs: clear to auscultation bilaterally Breasts: normal appearance, no masses or tenderness Heart: regular rate and rhythm, S1, S2 normal, no murmur, click, rub or gallop Abdomen: soft, non-tender; bowel sounds normal; no masses,  no organomegaly Extremities:  extremities normal, atraumatic, no cyanosis or edema Pulses: 2+ and symmetric Skin: Skin color, texture, turgor normal. No rashes or lesions Neurologic: Alert and oriented X 3, normal strength and tone. Normal symmetric reflexes. Normal coordination and gait.      Assessment & Plan:   Problem List Items Addressed This Visit    Cervical cancer screening    No longer necessary.  Reviewed path report from hysterectomy in 2010, and last PAP smear was done in 2016 which was satisfactory and contained no endocervical zone       Family history of colon cancer requiring screening colonoscopy    She has not had colonoscopy yet and her other had colon ca . Referring to [facility] again.       Relevant Orders   Ambulatory referral to Gastroenterology   Hot flushes, perimenopausal    Avoiding HRT given increased risk for DVT due to obesity.  Trial of lexapro       Hyperlipidemia   Relevant Medications   losartan-hydrochlorothiazide (HYZAAR) 100-25 MG tablet   Other Relevant Orders   LDL cholesterol, direct (Completed)   Hypertension    Resume losartan.hct and have bp checked ata work.  Goal 120/70.   Lab Results  Component Value Date   CREATININE 0.95 06/06/2016   Lab Results  Component Value Date   NA 141 06/06/2016   K 4.1 06/06/2016   CL 105 06/06/2016   CO2 30 06/06/2016  '  Relevant Medications   losartan-hydrochlorothiazide (HYZAAR) 100-25 MG tablet   Other Relevant Orders   Comprehensive metabolic panel (Completed)   Iron deficiency    Suggested on previous iron studies done to investigate symptoms suggestive of RLS.  Resolved,  No longer taking iron .   Lab Results  Component Value Date   IRON 70 03/21/2014   TIBC SEE NOTE 03/04/2014   FERRITIN 49.1 03/04/2014   Lab Results  Component Value Date   WBC 5.6 06/06/2016   HGB 12.2 06/06/2016   HCT 38.3 06/06/2016   MCV 84.2 06/06/2016   PLT 213.0 06/06/2016         Obesity (BMI 30-39.9)    I have addressed   BMI and recommended a low glycemic index diet utilizing smaller more frequent meals to increase metabolism.  I have also recommended that patient start exercising for 30 mintues,  5 days per week as her goal, Labs today:  Her direct LDL is 130,  She has normal thyroid function and prediabetes Lab Results  Component Value Date   HGBA1C 6.1 06/06/2016   Lab Results  Component Value Date   TSH 0.99 06/06/2016   Lab Results  Component Value Date   CHOL 197 02/27/2015   HDL 51.90 02/27/2015   LDLCALC 129 (H) 02/27/2015   LDLDIRECT 130.0 06/06/2016   TRIG 80.0 02/27/2015   CHOLHDL 4 02/27/2015         Prediabetes    Her  random glucose is not  elevated but her A1c suggests she is at risk for developing diabetes.  I recommend she follow a low glycemic index diet and particpate regularly in an aerobic  exercise activity.  We should check an A1c and fasting lipids in 6 months.   Lab Results  Component Value Date   HGBA1C 6.1 06/06/2016   Lab Results  Component Value Date   MICROALBUR 1.3 03/26/2013          Vitamin D deficiency   Relevant Orders   VITAMIN D 25 Hydroxy (Vit-D Deficiency, Fractures) (Completed)    Other Visit Diagnoses    Screening breast examination    -  Primary   Impaired fasting blood sugar       Relevant Orders   Hemoglobin A1c (Completed)   Breast cancer screening       Relevant Orders   MM DIGITAL SCREENING BILATERAL   Obesity (BMI 35.0-39.9 without comorbidity)       Relevant Orders   TSH (Completed)   History of iron deficiency anemia       Relevant Orders   CBC with Differential/Platelet (Completed)      I have discontinued Victoria Hardin's FERREX 28, potassium chloride SA, amoxicillin-clavulanate, HYDROcodone-homatropine, benzonatate, and predniSONE. I am also having her start on escitalopram. Additionally, I am having her maintain her multivitamin with minerals, ergocalciferol, and losartan-hydrochlorothiazide.  Meds ordered this encounter   Medications  . escitalopram (LEXAPRO) 5 MG tablet    Sig: Take 1 tablet (5 mg total) by mouth daily.    Dispense:  30 tablet    Refill:  5  . losartan-hydrochlorothiazide (HYZAAR) 100-25 MG tablet    Sig: Take 1 tablet by mouth daily.    Dispense:  90 tablet    Refill:  3    Medications Discontinued During This Encounter  Medication Reason  . amoxicillin-clavulanate (AUGMENTIN) 875-125 MG tablet Therapy completed  . benzonatate (TESSALON) 200 MG capsule Therapy completed  . FeAspGl-FeFum-B12-FA-C-Succ Ac (FERREX 28) TABS Patient has not taken  in last 30 days  . HYDROcodone-homatropine (HYCODAN) 5-1.5 MG/5ML syrup Therapy completed  . potassium chloride SA (K-DUR,KLOR-CON) 20 MEQ tablet Patient has not taken in last 30 days  . predniSONE (DELTASONE) 10 MG tablet Therapy completed  . losartan-hydrochlorothiazide (HYZAAR) 100-25 MG tablet Reorder    Follow-up: No Follow-up on file.   Sherlene Shams, MD

## 2016-06-07 ENCOUNTER — Encounter: Payer: Self-pay | Admitting: Internal Medicine

## 2016-06-07 DIAGNOSIS — Z124 Encounter for screening for malignant neoplasm of cervix: Secondary | ICD-10-CM | POA: Insufficient documentation

## 2016-06-07 DIAGNOSIS — R7303 Prediabetes: Secondary | ICD-10-CM | POA: Insufficient documentation

## 2016-06-07 NOTE — Assessment & Plan Note (Signed)
I have addressed  BMI and recommended a low glycemic index diet utilizing smaller more frequent meals to increase metabolism.  I have also recommended that patient start exercising for 30 mintues,  5 days per week as her goal, Labs today:  Her direct LDL is 130,  She has normal thyroid function and prediabetes Lab Results  Component Value Date   HGBA1C 6.1 06/06/2016   Lab Results  Component Value Date   TSH 0.99 06/06/2016   Lab Results  Component Value Date   CHOL 197 02/27/2015   HDL 51.90 02/27/2015   LDLCALC 129 (H) 02/27/2015   LDLDIRECT 130.0 06/06/2016   TRIG 80.0 02/27/2015   CHOLHDL 4 02/27/2015

## 2016-06-07 NOTE — Assessment & Plan Note (Signed)
She has not had colonoscopy yet and her other had colon ca . Referring to [facility] again.

## 2016-06-07 NOTE — Assessment & Plan Note (Signed)
Avoiding HRT given increased risk for DVT due to obesity.  Trial of lexapro

## 2016-06-07 NOTE — Assessment & Plan Note (Signed)
Resume losartan.hct and have bp checked ata work.  Goal 120/70.   Lab Results  Component Value Date   CREATININE 0.95 06/06/2016   Lab Results  Component Value Date   NA 141 06/06/2016   K 4.1 06/06/2016   CL 105 06/06/2016   CO2 30 06/06/2016  '

## 2016-06-07 NOTE — Assessment & Plan Note (Signed)
Her  random glucose is not  elevated but her A1c suggests she is at risk for developing diabetes.  I recommend she follow a low glycemic index diet and particpate regularly in an aerobic  exercise activity.  We should check an A1c and fasting lipids in 6 months.   Lab Results  Component Value Date   HGBA1C 6.1 06/06/2016   Lab Results  Component Value Date   MICROALBUR 1.3 03/26/2013

## 2016-06-07 NOTE — Assessment & Plan Note (Addendum)
No longer necessary.  Reviewed path report from hysterectomy in 2010, and last PAP smear was done in 2016 which was satisfactory and contained no endocervical zone

## 2016-06-07 NOTE — Assessment & Plan Note (Signed)
Suggested on previous iron studies done to investigate symptoms suggestive of RLS.  Resolved,  No longer taking iron .   Lab Results  Component Value Date   IRON 70 03/21/2014   TIBC SEE NOTE 03/04/2014   FERRITIN 49.1 03/04/2014   Lab Results  Component Value Date   WBC 5.6 06/06/2016   HGB 12.2 06/06/2016   HCT 38.3 06/06/2016   MCV 84.2 06/06/2016   PLT 213.0 06/06/2016

## 2016-08-03 ENCOUNTER — Ambulatory Visit: Payer: BC Managed Care – PPO

## 2016-08-12 LAB — HM COLONOSCOPY

## 2016-08-19 LAB — HM COLONOSCOPY

## 2016-08-29 ENCOUNTER — Encounter: Payer: Self-pay | Admitting: Internal Medicine

## 2016-09-12 ENCOUNTER — Encounter: Payer: Self-pay | Admitting: Internal Medicine

## 2016-09-14 ENCOUNTER — Ambulatory Visit (INDEPENDENT_AMBULATORY_CARE_PROVIDER_SITE_OTHER): Payer: BC Managed Care – PPO | Admitting: Internal Medicine

## 2016-09-14 ENCOUNTER — Encounter: Payer: Self-pay | Admitting: Internal Medicine

## 2016-09-14 VITALS — BP 102/70 | HR 81 | Temp 98.7°F | Resp 15 | Ht 63.0 in | Wt 205.4 lb

## 2016-09-14 DIAGNOSIS — E611 Iron deficiency: Secondary | ICD-10-CM | POA: Diagnosis not present

## 2016-09-14 DIAGNOSIS — R7303 Prediabetes: Secondary | ICD-10-CM

## 2016-09-14 DIAGNOSIS — J069 Acute upper respiratory infection, unspecified: Secondary | ICD-10-CM | POA: Diagnosis not present

## 2016-09-14 DIAGNOSIS — I10 Essential (primary) hypertension: Secondary | ICD-10-CM

## 2016-09-14 DIAGNOSIS — E7800 Pure hypercholesterolemia, unspecified: Secondary | ICD-10-CM

## 2016-09-14 DIAGNOSIS — B9789 Other viral agents as the cause of diseases classified elsewhere: Secondary | ICD-10-CM | POA: Diagnosis not present

## 2016-09-14 DIAGNOSIS — N951 Menopausal and female climacteric states: Secondary | ICD-10-CM

## 2016-09-14 MED ORDER — GUAIFENESIN-CODEINE 100-10 MG/5ML PO SYRP: 5.0000 mL | ORAL_SOLUTION | Freq: Three times a day (TID) | ORAL | 0 refills | Status: DC | PRN

## 2016-09-14 MED ORDER — ESCITALOPRAM OXALATE 10 MG PO TABS: 10.0000 mg | ORAL_TABLET | Freq: Every day | ORAL | 0 refills | Status: DC

## 2016-09-14 MED ORDER — BENZONATATE 200 MG PO CAPS: 200.0000 mg | ORAL_CAPSULE | Freq: Two times a day (BID) | ORAL | 0 refills | Status: DC | PRN

## 2016-09-14 MED ORDER — PREDNISONE 10 MG PO TABS: ORAL_TABLET | ORAL | 0 refills | Status: DC

## 2016-09-14 NOTE — Patient Instructions (Addendum)
You have a viral syndrome which is causing bronchitis.    The post nasal drip may also be contributing to  your  Cough.  I am prescribing a prednisone  Taper   to manage the inflammation in your bronchial tubes:  6 tablets all at once on Day 1,  Then taper by 1 tablet daily until gone  I  am adding  tessalon perles for daytime cough.  Save the cheratussin for nighttime cough because it has codeine in it      I also advise use of the following OTC meds to help with your other symptoms.   Continue generic OTC benadryl 25 mg  At bedtime for the drainage,, flush your sinuses twice daily with Lloyd HugerNeil Meds sinus sinse  You can use Sudafed PE every 6 hours for the congestion   Your coughing may last 7 to 10 days and [place] develop a low grade fever ( T > 100.4,  les than 102) but if you develop Green nasal discharge,  Ear  Or facial pain, we will start an  antibiotic   For the hot flashes  I am increasing the lexapro to 10 mg daily for one month,  If no improvement,  Let me know and we ill change therapy to Effexor

## 2016-09-14 NOTE — Progress Notes (Signed)
Subjective:  Patient ID: Victoria Hardin, female    DOB: Dec 07, 1965  Age: 51 y.o. MRN: [id number]  CC: The primary encounter diagnosis was Pure hypercholesterolemia. Diagnoses of Prediabetes, Essential hypertension, Iron deficiency, Viral URI with cough, and Hot flushes, perimenopausal were also pertinent to this visit.  HPI Victoria Hardin presents for URI symptoms that have been present for the past week   Sinus drainage causing cough,  Worse at night . No sinus pain,  Cough is minimally productive .  No fevers.  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   2) Menopause:  Continues to have distressing hot flashes despite starting lexapro 5 mg ,  Still having 2 daily that are drenching,  Wants to increase dose.  Non smoker, no known C/i to estrogen therapy . S/p TAH .   Outpatient Medications Prior to Visit  Medication Sig Dispense Refill  . losartan-hydrochlorothiazide (HYZAAR) 100-25 MG tablet Take 1 tablet by mouth daily. 90 tablet 3  . Multiple Vitamins-Minerals (MULTIVITAMIN WITH MINERALS) tablet Take 1 tablet by mouth daily.    Marland Kitchen escitalopram (LEXAPRO) 5 MG tablet Take 1 tablet (5 mg total) by mouth daily. 30 tablet 5  . ergocalciferol (DRISDOL) 50000 units capsule Take 1 capsule (50,000 Units total) by mouth once a week. (Patient not taking: Reported on 09/14/2016) 12 capsule 0   No facility-administered medications prior to visit.     Review of Systems;  Patient denies headache, fevers, malaise, unintentional weight loss, skin rash, eye pain, sinus congestion and sinus pain, sore throat, dysphagia,  hemoptysis , cough, dyspnea, wheezing, chest pain, palpitations, orthopnea, edema, abdominal pain, nausea, melena, diarrhea, constipation, flank pain, dysuria, hematuria, urinary  Frequency, nocturia, numbness, tingling, seizures,  Focal weakness, Loss of consciousness,  Tremor, insomnia, depression, anxiety, and suicidal ideation.       Objective:  BP 102/70 (BP Location: Left Arm, Patient Position: Sitting, Cuff Size: Large)   Pulse 81   Temp 98.7 F (37.1 C) (Oral)   Resp 15   Ht 5\' 3"  (1.6 m)   Wt 205 lb 6.4 oz (93.2 kg)   SpO2 98%   BMI 36.38 kg/m   BP Readings from Last 3 Encounters:  09/14/16 102/70  06/06/16 (!) 142/100  02/03/16 130/90    Wt Readings from Last 3 Encounters:  09/14/16 205 lb 6.4 oz (93.2 kg)  06/06/16 206 lb 1.9 oz (93.5 kg)  02/03/16 199 lb 6.4 oz (90.4 kg)    General appearance: alert, cooperative and appears stated age Ears: normal TM's and external ear canals both ears Throat: lips, mucosa, and tongue normal; teeth and gums normal Neck: no adenopathy, no carotid bruit, supple, symmetrical, trachea midline and thyroid not enlarged, symmetric, no tenderness/mass/nodules Back: symmetric, no curvature. ROM normal. No CVA tenderness. Lungs: clear to auscultation bilaterally Heart: regular rate and rhythm, S1, S2 normal, no murmur, click, rub or gallop Abdomen: soft, non-tender; bowel sounds normal; no masses,  no organomegaly Pulses: 2+ and symmetric Skin: Skin color, texture, turgor normal. No rashes or lesions Lymph nodes: Cervical, supraclavicular, and axillary nodes normal.  Lab Results  Component Value Date   HGBA1C 6.1 06/06/2016    Lab Results  Component Value Date   CREATININE 0.95 06/06/2016   CREATININE 0.88 10/05/2015   CREATININE 0.86 02/27/2015    Lab Results  Component Value Date   WBC 5.6 06/06/2016   HGB 12.2 06/06/2016   HCT 38.3 06/06/2016   PLT 213.0 06/06/2016  GLUCOSE 93 06/06/2016   CHOL 197 02/27/2015   TRIG 80.0 02/27/2015   HDL 51.90 02/27/2015   LDLDIRECT 130.0 06/06/2016   LDLCALC 129 (H) 02/27/2015   ALT 21 06/06/2016   AST 21 06/06/2016   NA 141 06/06/2016   K 4.1 06/06/2016   CL 105 06/06/2016   CREATININE 0.95 06/06/2016   BUN 16 06/06/2016   CO2 30 06/06/2016   TSH 0.99 06/06/2016   HGBA1C 6.1 06/06/2016   MICROALBUR  1.3 03/26/2013      Assessment & Plan:   Problem List Items Addressed This Visit    Viral URI with cough    URI is most likely viral given the mild HEENT Symptoms  And normal exam.   I have explained that in viral URIS, an antibiotic will not help the symptoms and will increase the risk of developing diarrhea.,  Continue oral and nasal decongestants,prn tylenol 650 mq 8 hrs for aches and pains,  Sinus flushes with Lloyd HugerNeil Med's rinse,  prednisone  taper for inflammation, and cough suppressant.   Advised to start antibiotic only if symptoms of bacterial sinusitis occur, and advised to take probiotic if the antibiotic is taken,  For a minimum of 3 weeks.       Prediabetes    Her  random glucose is not  elevated but her A1c suggests she is at risk for developing diabetes.  I recommend she follow a low glycemic index diet and particpate regularly in an aerobic  exercise activity.  We should check an A1c and fasting lipids every  6 months.   Lab Results  Component Value Date   HGBA1C 6.1 06/06/2016   Lab Results  Component Value Date   MICROALBUR 1.3 03/26/2013          Relevant Orders   Comprehensive metabolic panel   Hemoglobin A1c   LDL cholesterol, direct   Lipid panel   Iron deficiency   Relevant Orders   Iron and TIBC   CBC with Differential/Platelet   Hypertension    Well controlled on current regimen. Renal function stable, no changes today.  Lab Results  Component Value Date   CREATININE 0.95 06/06/2016   Lab Results  Component Value Date   NA 141 06/06/2016   K 4.1 06/06/2016   CL 105 06/06/2016   CO2 30 06/06/2016  .      Relevant Orders   Microalbumin / creatinine urine ratio   Hyperlipidemia - Primary   Hot flushes, perimenopausal    No improvement. Will increase lexapro to 10 mg.  If no relief ,  Estradiol.  S/p TAH, non smoker.          I have discontinued Ms. Hollars's ergocalciferol. I have also changed her escitalopram. Additionally, I am having  her start on predniSONE, benzonatate, and guaiFENesin-codeine. Lastly, I am having her maintain her multivitamin with minerals and losartan-hydrochlorothiazide.  Meds ordered this encounter  Medications  . escitalopram (LEXAPRO) 10 MG tablet    Sig: Take 1 tablet (10 mg total) by mouth daily.    Dispense:  30 tablet    Refill:  0  . predniSONE (DELTASONE) 10 MG tablet    Sig: 6 tablets on Day 1 , then reduce by 1 tablet daily until gone    Dispense:  21 tablet    Refill:  0  . benzonatate (TESSALON) 200 MG capsule    Sig: Take 1 capsule (200 mg total) by mouth 2 (two) times daily as needed for cough.  Dispense:  20 capsule    Refill:  0  . guaiFENesin-codeine (CHERATUSSIN AC) 100-10 MG/5ML syrup    Sig: Take 5 mLs by mouth 3 (three) times daily as needed for cough.    Dispense:  120 mL    Refill:  0    Medications Discontinued During This Encounter  Medication Reason  . ergocalciferol (DRISDOL) 50000 units capsule Therapy completed  . escitalopram (LEXAPRO) 5 MG tablet Reorder    Follow-up: Return in about 3 months (around 12/15/2016) for prediabetes follow up ,  labs prior to visit .   Sherlene ShamsULLO, Mihira Tozzi L, MD

## 2016-09-15 ENCOUNTER — Encounter: Payer: Self-pay | Admitting: Internal Medicine

## 2016-09-15 DIAGNOSIS — B9789 Other viral agents as the cause of diseases classified elsewhere: Secondary | ICD-10-CM

## 2016-09-15 DIAGNOSIS — J069 Acute upper respiratory infection, unspecified: Secondary | ICD-10-CM | POA: Insufficient documentation

## 2016-09-15 NOTE — Assessment & Plan Note (Signed)
No improvement. Will increase lexapro to 10 mg.  If no relief ,  Estradiol.  S/p TAH, non smoker.

## 2016-09-15 NOTE — Assessment & Plan Note (Signed)
URI is most likely viral given the mild HEENT Symptoms  And normal exam.   I have explained that in viral URIS, an antibiotic will not help the symptoms and will increase the risk of developing diarrhea.,  Continue oral and nasal decongestants,prn tylenol 650 mq 8 hrs for aches and pains,  Sinus flushes with Neil Med's rinse,  prednisone  taper for inflammation, and cough suppressant.   Advised to start antibiotic only if symptoms of bacterial sinusitis occur, and advised to take probiotic if the antibiotic is taken,  For a minimum of 3 weeks.  

## 2016-09-15 NOTE — Assessment & Plan Note (Signed)
Well controlled on current regimen. Renal function stable, no changes today.  Lab Results  Component Value Date   CREATININE 0.95 06/06/2016   Lab Results  Component Value Date   NA 141 06/06/2016   K 4.1 06/06/2016   CL 105 06/06/2016   CO2 30 06/06/2016  .

## 2016-09-15 NOTE — Assessment & Plan Note (Signed)
Her  random glucose is not  elevated but her A1c suggests she is at risk for developing diabetes.  I recommend she follow a low glycemic index diet and particpate regularly in an aerobic  exercise activity.  We should check an A1c and fasting lipids every  6 months.   Lab Results  Component Value Date   HGBA1C 6.1 06/06/2016   Lab Results  Component Value Date   MICROALBUR 1.3 03/26/2013

## 2016-11-02 ENCOUNTER — Encounter: Payer: Self-pay | Admitting: Internal Medicine

## 2016-11-14 ENCOUNTER — Other Ambulatory Visit (INDEPENDENT_AMBULATORY_CARE_PROVIDER_SITE_OTHER): Payer: BC Managed Care – PPO

## 2016-11-14 DIAGNOSIS — I10 Essential (primary) hypertension: Secondary | ICD-10-CM

## 2016-11-14 DIAGNOSIS — E611 Iron deficiency: Secondary | ICD-10-CM | POA: Diagnosis not present

## 2016-11-14 DIAGNOSIS — R7303 Prediabetes: Secondary | ICD-10-CM | POA: Diagnosis not present

## 2016-11-14 LAB — COMPREHENSIVE METABOLIC PANEL
ALK PHOS: 78 U/L (ref 39–117)
ALT: 11 U/L (ref 0–35)
AST: 14 U/L (ref 0–37)
Albumin: 3.7 g/dL (ref 3.5–5.2)
BUN: 9 mg/dL (ref 6–23)
CALCIUM: 9.4 mg/dL (ref 8.4–10.5)
CO2: 25 meq/L (ref 19–32)
CREATININE: 0.74 mg/dL (ref 0.40–1.20)
Chloride: 102 mEq/L (ref 96–112)
GFR: 105.84 mL/min (ref >60.00)
GLUCOSE: 88 mg/dL (ref 70–99)
Potassium: 4.1 mEq/L (ref 3.5–5.1)
Sodium: 137 mEq/L (ref 135–145)
Total Bilirubin: 0.5 mg/dL (ref 0.2–1.2)
Total Protein: 7 g/dL (ref 6.0–8.3)

## 2016-11-14 LAB — CBC WITH DIFFERENTIAL/PLATELET
BASOS ABS: 0 10*3/uL (ref 0.0–0.1)
Basophils Relative: 0.3 % (ref 0.0–3.0)
Eosinophils Absolute: 0.1 10*3/uL (ref 0.0–0.7)
Eosinophils Relative: 0.8 % (ref 0.0–5.0)
HEMATOCRIT: 38 % (ref 36.0–46.0)
Hemoglobin: 12.2 g/dL (ref 12.0–15.0)
LYMPHS PCT: 14.5 % (ref 12.0–46.0)
Lymphs Abs: 1.6 10*3/uL (ref 0.7–4.0)
MCHC: 32.1 g/dL (ref 30.0–36.0)
MCV: 84.6 fl (ref 78.0–100.0)
MONO ABS: 0.5 10*3/uL (ref 0.1–1.0)
Monocytes Relative: 4.7 % (ref 3.0–12.0)
Neutro Abs: 8.7 10*3/uL — ABNORMAL HIGH (ref 1.4–7.7)
Neutrophils Relative %: 79.7 % — ABNORMAL HIGH (ref 43.0–77.0)
Platelets: 247 10*3/uL (ref 150.0–400.0)
RBC: 4.49 Mil/uL (ref 3.87–5.11)
RDW: 15.4 % (ref 11.5–15.5)
WBC: 10.9 10*3/uL — AB (ref 4.0–10.5)

## 2016-11-14 LAB — IRON, TOTAL/TOTAL IRON BINDING CAP
%SAT: 13 % (calc) (ref 11–50)
Iron: 35 ug/dL — ABNORMAL LOW (ref 45–160)
TIBC: 262 ug/dL (ref 250–450)

## 2016-11-14 LAB — LIPID PANEL
Cholesterol: 189 mg/dL (ref 0–200)
HDL: 50.9 mg/dL (ref >39.00)
LDL CALC: 124 mg/dL — AB (ref 0–99)
NONHDL: 137.79
Total CHOL/HDL Ratio: 4
Triglycerides: 71 mg/dL (ref 0.0–149.0)
VLDL: 14.2 mg/dL (ref 0.0–40.0)

## 2016-11-14 LAB — LDL CHOLESTEROL, DIRECT: Direct LDL: 119 mg/dL

## 2016-11-14 LAB — MICROALBUMIN / CREATININE URINE RATIO
CREATININE, U: 112.2 mg/dL
MICROALB/CREAT RATIO: 0.6 mg/g (ref 0.0–30.0)

## 2016-11-14 LAB — HEMOGLOBIN A1C: Hgb A1c MFr Bld: 5.9 % (ref 4.6–6.5)

## 2016-11-16 ENCOUNTER — Ambulatory Visit (INDEPENDENT_AMBULATORY_CARE_PROVIDER_SITE_OTHER): Payer: BC Managed Care – PPO | Admitting: Internal Medicine

## 2016-11-16 ENCOUNTER — Encounter: Payer: Self-pay | Admitting: Internal Medicine

## 2016-11-16 VITALS — BP 110/76 | HR 85 | Temp 98.8°F | Resp 15 | Ht 63.0 in | Wt 208.6 lb

## 2016-11-16 DIAGNOSIS — J310 Chronic rhinitis: Secondary | ICD-10-CM

## 2016-11-16 DIAGNOSIS — E669 Obesity, unspecified: Secondary | ICD-10-CM | POA: Diagnosis not present

## 2016-11-16 DIAGNOSIS — M5416 Radiculopathy, lumbar region: Secondary | ICD-10-CM | POA: Diagnosis not present

## 2016-11-16 DIAGNOSIS — N951 Menopausal and female climacteric states: Secondary | ICD-10-CM

## 2016-11-16 DIAGNOSIS — R7303 Prediabetes: Secondary | ICD-10-CM

## 2016-11-16 NOTE — Patient Instructions (Addendum)
your A1c is getting lower (that's good!) which means your chance of developing diabetes is decreasing.    HOWEVE,  I  WANT YOU TO LOSE WEIGHT!   Your first goal is 20 lb over  6 months  Long term goal is to get BMI < 30   (168 lbs)     You should continue sinus  irrigation twice daily.  You can try using  NeilMed's Sinus rinse ;  It is a stong sinus "flush" using water and medicated salts.  Do it over the sink because it can be a bit messy.   Add an antihistamine to your twice daily steroid spray if you are having allergy symptoms    WE will figure out how to run the mucus test that your ENT doctor   wanted to run

## 2016-11-16 NOTE — Progress Notes (Signed)
Subjective:  Patient ID: Victoria Hardin, female    DOB: October 14, 1965  Age: 51 y.o. MRN: [id number]  CC: The primary encounter diagnosis was Chronic rhinitis. Diagnoses of Hot flushes, perimenopausal, Obesity (BMI 30-39.9), Lumbar radiculitis, and Prediabetes were also pertinent to this visit.  HPI Victoria Hardin presents for follow up on obesity with prediabetes, new onset menopause syndrome.  Last seen August 1.   lexapro dose increasedto 10 mg  Daily.  She reports an improved frequency and tolerance of flushing, no longer occurring daily  Sleeping better,  Less irritable.   Prediabetes: she has reduced a1c from 6.1 to 5.9 by reducing the carbs in her diet.  However she has not lost any weight ; BMI 36  She reports Bilateral leg pain from thigh to ankle.  More pronounced on the left side more than on the right.  The pain is aggravated by lying down and improves with activity. She has no history of trauma or scoliosis.  The pain is chronic and has been present since 2017.  She was treated for trochanteric bursitis last year by Dr Yves Dill with no imporvement.  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  No imaging studies done    persistent right sided sinus drainage:  She was referred to ENT at [facility]  For evaluation.  Nasal endoscopy was normal.  She has been intermittently irrigating her sinuses as directed  Using a buffered isotonic saline solution But has not been back to see her. the MD at [facility] wanted to run a test to confirm that she was not having csf fluid leak (despite no history of head or facial trauma) , but she has not returned to her and is wondering if the test can be run and interpreted locally.   She has the contained that was given to her by Dr  Gabriel Cirri.     Outpatient Medications Prior to Visit  Medication Sig Dispense Refill  . escitalopram (LEXAPRO) 10 MG tablet Take 1 tablet (10 mg total) by mouth daily. 30 tablet 0  . losartan-hydrochlorothiazide (HYZAAR) 100-25 MG tablet Take 1 tablet by  mouth daily. 90 tablet 3  . Multiple Vitamins-Minerals (MULTIVITAMIN WITH MINERALS) tablet Take 1 tablet by mouth daily.    . benzonatate (TESSALON) 200 MG capsule Take 1 capsule (200 mg total) by mouth 2 (two) times daily as needed for cough. (Patient not taking: Reported on 11/16/2016) 20 capsule 0  . guaiFENesin-codeine (CHERATUSSIN AC) 100-10 MG/5ML syrup Take 5 mLs by mouth 3 (three) times daily as needed for cough. (Patient not taking: Reported on 11/16/2016) 120 mL 0  . predniSONE (DELTASONE) 10 MG tablet 6 tablets on Day 1 , then reduce by 1 tablet daily until gone (Patient not taking: Reported on 11/16/2016) 21 tablet 0   No facility-administered medications prior to visit.     Review of Systems;  Patient denies headache, fevers, malaise, unintentional weight loss, skin rash, eye pain, sinus cpain , sore throat, dysphagia,  hemoptysis , cough, dyspnea, wheezing, chest pain, palpitations, orthopnea, edema, abdominal pain, nausea, melena, diarrhea, constipation, flank pain, dysuria, hematuria, urinary  Frequency, nocturia, numbness, tingling, seizures,  Focal weakness, Loss of consciousness,  Tremor, insomnia, depression, anxiety, and suicidal ideation.      Objective:  BP 110/76 (BP Location: Left Arm, Patient Position: Sitting, Cuff Size: Normal)   Pulse 85   Temp 98.8 F (37.1 C) (Oral)   Resp 15   Ht  (1.6 m)   Wt 208 lb 9.6 oz (94.6 kg)  SpO2 94%   BMI 36.95 kg/m   BP Readings from Last 3 Encounters:  11/16/16 110/76  09/14/16 102/70  06/06/16 (!) 142/100    Wt Readings from Last 3 Encounters:  11/16/16 208 lb 9.6 oz (94.6 kg)  09/14/16 205 lb 6.4 oz (93.2 kg)  06/06/16 206 lb 1.9 oz (93.5 kg)    General appearance: alert, cooperative and appears stated age Ears: normal TM's and external ear canals both ears Throat: lips, mucosa, and tongue normal; teeth and gums normal Neck: no adenopathy, no carotid bruit, supple, symmetrical, trachea midline and thyroid not  enlarged, symmetric, no tenderness/mass/nodules Back: symmetric, no curvature. ROM normal. No CVA tenderness. Lungs: clear to auscultation bilaterally Heart: regular rate and rhythm, S1, S2 normal, no murmur, click, rub or gallop Abdomen: soft, non-tender; bowel sounds normal; no masses,  no organomegaly Pulses: 2+ and symmetric Skin: Skin color, texture, turgor normal. No rashes or lesions Lymph nodes: Cervical, supraclavicular, and axillary nodes normal. MSK:  5/5 strength in all fields.  Normal ROM .  No joint effusions.   Lab Results  Component Value Date   HGBA1C 5.9 11/14/2016   HGBA1C 6.1 06/06/2016    Lab Results  Component Value Date   CREATININE 0.74 11/14/2016   CREATININE 0.95 06/06/2016   CREATININE 0.88 10/05/2015    Lab Results  Component Value Date   WBC 10.9 (H) 11/14/2016   HGB 12.2 11/14/2016   HCT 38.0 11/14/2016   PLT 247.0 11/14/2016   GLUCOSE 88 11/14/2016   CHOL 189 11/14/2016   TRIG 71.0 11/14/2016   HDL 50.90 11/14/2016   LDLDIRECT 119.0 11/14/2016   LDLCALC 124 (H) 11/14/2016   ALT 11 11/14/2016   AST 14 11/14/2016   NA 137 11/14/2016   K 4.1 11/14/2016   CL 102 11/14/2016   CREATININE 0.74 11/14/2016   BUN 9 11/14/2016   CO2 25 11/14/2016   TSH 0.99 06/06/2016   HGBA1C 5.9 11/14/2016   MICROALBUR <0.7 11/14/2016      Assessment & Plan:   Problem List Items Addressed This Visit    Hot flushes, perimenopausal    Improved with increased lexapro  dose.  No changes today       Lumbar radiculitis    Chronic.  Initially treated for trochanteric bursitis in 2017 by Dr Yves Dill with only transient relief.  MRI was considered if no improvedment with  Gabapentin       Relevant Orders   DG Lumbar Spine Complete   Obesity (BMI 30-39.9)    I have addressed  BMI and recommended wt loss of 10% of body weight over the next 6 months using a low glycemic index diet and regular exercise a minimum of 5 days per week.  Her current pain complaint   Of leg pain appears y history oto be due to OA since it is aggravated by sleeping and improves  with exercise      Prediabetes    Her  random glucose is not  elevated and  her A1c has improved, but she remains at risk for developing diabetes.  I recommend she follow a low glycemic index diet and particpate regularly in an aerobic  exercise activity.  We should check an A1c and fasting lipids every  6 months.   Lab Results  Component Value Date   HGBA1C 5.9 11/14/2016   Lab Results  Component Value Date   MICROALBUR <0.7 11/14/2016           Other Visit Diagnoses  Chronic rhinitis    -  Primary   Relevant Orders   Beta 2 transferrin    A total of 25 minutes of face to face time was spent with patient more than half of which was spent in counselling and coordination of care    I have discontinued Ms. Weisbecker's predniSONE, benzonatate, and guaiFENesin-codeine. I am also having her maintain her multivitamin with minerals, losartan-hydrochlorothiazide, and escitalopram.  No orders of the defined types were placed in this encounter.   Medications Discontinued During This Encounter  Medication Reason  . predniSONE (DELTASONE) 10 MG tablet Completed Course  . guaiFENesin-codeine (CHERATUSSIN AC) 100-10 MG/5ML syrup Patient has not taken in last 30 days  . benzonatate (TESSALON) 200 MG capsule Patient has not taken in last 30 days    Follow-up: Return in about 3 months (around 02/16/2017).   Sherlene Shams, MD

## 2016-11-19 NOTE — Assessment & Plan Note (Signed)
Chronic.  Initially treated for trochanteric bursitis in 2017 by Dr Yves Dill with only transient relief.  MRI was considered if no improvedment with  Gabapentin

## 2016-11-19 NOTE — Assessment & Plan Note (Signed)
Improved with increased lexapro  dose.  No changes today

## 2016-11-19 NOTE — Assessment & Plan Note (Signed)
Her  random glucose is not  elevated and  her A1c has improved, but she remains at risk for developing diabetes.  I recommend she follow a low glycemic index diet and particpate regularly in an aerobic  exercise activity.  We should check an A1c and fasting lipids every  6 months.   Lab Results  Component Value Date   HGBA1C 5.9 11/14/2016   Lab Results  Component Value Date   MICROALBUR <0.7 11/14/2016

## 2016-11-19 NOTE — Assessment & Plan Note (Signed)
I have addressed  BMI and recommended wt loss of 10% of body weight over the next 6 months using a low glycemic index diet and regular exercise a minimum of 5 days per week.  Her current pain complaint  Of leg pain appears y history oto be due to OA since it is aggravated by sleeping and improves  with exercise

## 2016-11-22 ENCOUNTER — Encounter: Payer: Self-pay | Admitting: Internal Medicine

## 2016-11-22 ENCOUNTER — Ambulatory Visit (INDEPENDENT_AMBULATORY_CARE_PROVIDER_SITE_OTHER): Payer: BC Managed Care – PPO

## 2016-11-22 DIAGNOSIS — M5416 Radiculopathy, lumbar region: Secondary | ICD-10-CM

## 2016-11-22 DIAGNOSIS — M5136 Other intervertebral disc degeneration, lumbar region: Secondary | ICD-10-CM

## 2016-12-07 ENCOUNTER — Ambulatory Visit: Payer: BC Managed Care – PPO

## 2016-12-14 ENCOUNTER — Ambulatory Visit
Admission: RE | Admit: 2016-12-14 | Discharge: 2016-12-14 | Disposition: A | Discharge status: Home / Self Care | Payer: BC Managed Care – PPO | Source: Ambulatory Visit | Attending: Internal Medicine | Admitting: Internal Medicine

## 2016-12-14 DIAGNOSIS — M5137 Other intervertebral disc degeneration, lumbosacral region: Secondary | ICD-10-CM | POA: Diagnosis not present

## 2016-12-14 DIAGNOSIS — M4686 Other specified inflammatory spondylopathies, lumbar region: Secondary | ICD-10-CM | POA: Insufficient documentation

## 2016-12-14 DIAGNOSIS — M5136 Other intervertebral disc degeneration, lumbar region: Secondary | ICD-10-CM | POA: Diagnosis present

## 2016-12-14 DIAGNOSIS — M5116 Intervertebral disc disorders with radiculopathy, lumbar region: Secondary | ICD-10-CM | POA: Diagnosis not present

## 2016-12-15 ENCOUNTER — Encounter: Payer: Self-pay | Admitting: Internal Medicine

## 2017-02-16 ENCOUNTER — Ambulatory Visit: Payer: BC Managed Care – PPO | Admitting: Internal Medicine

## 2017-02-16 ENCOUNTER — Telehealth: Payer: Self-pay | Admitting: Internal Medicine

## 2017-02-16 ENCOUNTER — Encounter: Payer: Self-pay | Admitting: Internal Medicine

## 2017-02-16 VITALS — BP 110/80 | HR 77 | Temp 98.0°F | Resp 15 | Ht 63.0 in | Wt 210.2 lb

## 2017-02-16 DIAGNOSIS — R059 Cough, unspecified: Secondary | ICD-10-CM

## 2017-02-16 DIAGNOSIS — E7800 Pure hypercholesterolemia, unspecified: Secondary | ICD-10-CM

## 2017-02-16 DIAGNOSIS — E669 Obesity, unspecified: Secondary | ICD-10-CM

## 2017-02-16 DIAGNOSIS — M5416 Radiculopathy, lumbar region: Secondary | ICD-10-CM

## 2017-02-16 DIAGNOSIS — R7303 Prediabetes: Secondary | ICD-10-CM

## 2017-02-16 DIAGNOSIS — R05 Cough: Secondary | ICD-10-CM | POA: Diagnosis not present

## 2017-02-16 LAB — LIPID PANEL
CHOL/HDL RATIO: 4
CHOLESTEROL: 194 mg/dL (ref 0–200)
HDL: 48.3 mg/dL (ref >39.00)
LDL CALC: 126 mg/dL — AB (ref 0–99)
NonHDL: 145.85
Triglycerides: 97 mg/dL (ref 0.0–149.0)
VLDL: 19.4 mg/dL (ref 0.0–40.0)

## 2017-02-16 LAB — COMPREHENSIVE METABOLIC PANEL
ALK PHOS: 92 U/L (ref 39–117)
ALT: 12 U/L (ref 0–35)
AST: 15 U/L (ref 0–37)
Albumin: 4.1 g/dL (ref 3.5–5.2)
BUN: 15 mg/dL (ref 6–23)
CO2: 31 meq/L (ref 19–32)
CREATININE: 0.79 mg/dL (ref 0.40–1.20)
Calcium: 9.7 mg/dL (ref 8.4–10.5)
Chloride: 101 mEq/L (ref 96–112)
GFR: 98.05 mL/min (ref >60.00)
Glucose, Bld: 84 mg/dL (ref 70–99)
Potassium: 3.5 mEq/L (ref 3.5–5.1)
Sodium: 138 mEq/L (ref 135–145)
Total Bilirubin: 0.5 mg/dL (ref 0.2–1.2)
Total Protein: 7.9 g/dL (ref 6.0–8.3)

## 2017-02-16 LAB — HEMOGLOBIN A1C: Hgb A1c MFr Bld: 6.3 % (ref 4.6–6.5)

## 2017-02-16 MED ORDER — MOMETASONE FUROATE 50 MCG/ACT NA SUSP: 2.0000 | Freq: Every day | NASAL | 12 refills | Status: DC

## 2017-02-16 MED ORDER — ESCITALOPRAM OXALATE 10 MG PO TABS: 10.0000 mg | ORAL_TABLET | Freq: Every day | ORAL | 5 refills | Status: DC

## 2017-02-16 NOTE — Telephone Encounter (Signed)
Medication has been faxed to pharmacy 

## 2017-02-16 NOTE — Patient Instructions (Signed)
Continue the zyrtec and sterid nasal spray and irrigations   ENT referral to [facility] provider in [place] (if there is one!)  Ok to use magnesium and tylenol  for leg pain.  If it becomes intolerable,  Consider seeing Dr Shirlyn Goltzchesnis for epidural steroid injection

## 2017-02-16 NOTE — Telephone Encounter (Signed)
Please advise 

## 2017-02-16 NOTE — Progress Notes (Signed)
Subjective:  Patient ID: Nicky PughFelecia Mak, female    DOB: Aug 25, 1965  Age: 52 y.o. MRN: [id number]  CC: The primary encounter diagnosis was Pure hypercholesterolemia. Diagnoses of Prediabetes, Lumbar radiculitis, Obesity (BMI 30-39.9), and Cough were also pertinent to this visit.  HPI Nicky PughFelecia Mcmath presents for follow up on menopause treated with lexapro,  Right leg pain persistent  (gabapentin added) presumed secondary to L4 nerve radiculopathy,  Obesity,  prediabetes , hypertension and cough due to chronic rhinitis   Persistent cough:  She states that her cough has improved but is brought on by laughing but not by eating . Taking zyrtec once daily at 8 pm,  Does not cough during the night,  But returns during the day .   Tried flonase, but did not continue due to cost . Saline irrigations .  Has not been back to [facility],  [place]ants to see a provider in [place] .   Left Leg pain with radiculopathy:  Her pain is  transient  And relieved with hip flexion,  Worse with straight leg.   Worse at night,  Gabapentin helped but had  too many side effects.  Using otc magnesium.  Reviewed MRI Lumbar spine done after last visit .  No foraminal or spinal stenosis noted.   Obesity:  Has gained weight. Not exercising ,  lacking motivation to start.   Outpatient Medications Prior to Visit  Medication Sig Dispense Refill  . losartan-hydrochlorothiazide (HYZAAR) 100-25 MG tablet Take 1 tablet by mouth daily. 90 tablet 3  . Multiple Vitamins-Minerals (MULTIVITAMIN WITH MINERALS) tablet Take 1 tablet by mouth daily.    Marland Kitchen. escitalopram (LEXAPRO) 10 MG tablet Take 1 tablet (10 mg total) by mouth daily. 30 tablet 0   No facility-administered medications prior to visit.     Review of Systems;  Patient denies headache, fevers, malaise, unintentional weight loss, skin rash, eye pain, sinus congestion and sinus pain, sore throat, dysphagia,  hemoptysis , cough, dyspnea, wheezing, chest pain, palpitations, orthopnea,  edema, abdominal pain, nausea, melena, diarrhea, constipation, flank pain, dysuria, hematuria, urinary  Frequency, nocturia, numbness, tingling, seizures,  Focal weakness, Loss of consciousness,  Tremor, insomnia, depression, anxiety, and suicidal ideation.      Objective:  BP 110/80 (BP Location: Left Arm, Patient Position: Sitting, Cuff Size: Large)   Pulse 77   Temp 98 F (36.7 C) (Oral)   Resp 15   Ht 5\' 3"  (1.6 m)   Wt 210 lb 3.2 oz (95.3 kg)   SpO2 93%   BMI 37.24 kg/m   BP Readings from Last 3 Encounters:  02/16/17 110/80  11/16/16 110/76  09/14/16 102/70    Wt Readings from Last 3 Encounters:  02/16/17 210 lb 3.2 oz (95.3 kg)  11/16/16 208 lb 9.6 oz (94.6 kg)  09/14/16 205 lb 6.4 oz (93.2 kg)    General appearance: alert, cooperative and appears stated age Ears: normal TM's and external ear canals both ears Throat: lips, mucosa, and tongue normal; teeth and gums normal Neck: no adenopathy, no carotid bruit, supple, symmetrical, trachea midline and thyroid not enlarged, symmetric, no tenderness/mass/nodules Back: symmetric, no curvature. ROM normal. No CVA tenderness. Lungs: clear to auscultation bilaterally Heart: regular rate and rhythm, S1, S2 normal, no murmur, click, rub or gallop Abdomen: soft, non-tender; bowel sounds normal; no masses,  no organomegaly Pulses: 2+ and symmetric Skin: Skin color, texture, turgor normal. No rashes or lesions Lymph nodes: Cervical, supraclavicular, and axillary nodes normal.  Lab Results  Component Value  Date   HGBA1C 6.3 02/16/2017   HGBA1C 5.9 11/14/2016   HGBA1C 6.1 06/06/2016    Lab Results  Component Value Date   CREATININE 0.79 02/16/2017   CREATININE 0.74 11/14/2016   CREATININE 0.95 06/06/2016    Lab Results  Component Value Date   WBC 10.9 (H) 11/14/2016   HGB 12.2 11/14/2016   HCT 38.0 11/14/2016   PLT 247.0 11/14/2016   GLUCOSE 84 02/16/2017   CHOL 194 02/16/2017   TRIG 97.0 02/16/2017   HDL 48.30  02/16/2017   LDLDIRECT 119.0 11/14/2016   LDLCALC 126 (H) 02/16/2017   ALT 12 02/16/2017   AST 15 02/16/2017   NA 138 02/16/2017   K 3.5 02/16/2017   CL 101 02/16/2017   CREATININE 0.79 02/16/2017   BUN 15 02/16/2017   CO2 31 02/16/2017   TSH 0.99 06/06/2016   HGBA1C 6.3 02/16/2017   MICROALBUR <0.7 11/14/2016    Mr Lumbar Spine Wo Contrast  Result Date: 12/14/2016 CLINICAL DATA:  Low back pain and bilateral leg pain. Radiculopathy. EXAM: MRI LUMBAR SPINE WITHOUT CONTRAST TECHNIQUE: Multiplanar, multisequence MR imaging of the lumbar spine was performed. No intravenous contrast was administered. COMPARISON:  Radiographs dated 11/22/2016 FINDINGS: Segmentation:  Standard. Alignment:  Mild lumbar scoliosis with convexity to the left. Vertebrae:  No fracture, evidence of discitis, or bone lesion. Conus medullaris: Extends to the L2 level and appears normal. Paraspinal and other soft tissues: Negative. Disc levels: T12-L1 through L3-4: Normal discs. Minimal facet arthritis at L3-4 on the right. L4-5: Tiny right foraminal disc bulge with no neural impingement. Minimal right facet arthritis. L5-S1: Tiny broad-based disc bulge. No neural impingement. Slight degenerative changes of the facet joints. IMPRESSION: 1. Minimal degenerative disc disease at L4-5 and L5-S1 without neural impingement. 2. Minimal facet arthritis on the right at L3-4 and L4-5 and bilaterally at L5-S1. Electronically Signed   By: Francene Boyers M.D.   On: 12/14/2016 10:05    Assessment & Plan:   Problem List Items Addressed This Visit    Hyperlipidemia - Primary   Relevant Orders   Lipid panel (Completed)   Cough    She has had some improvement with treatment of allergic rhinitis but stopped using flonase due to cost.  Prescribing nasonex as an alternative,  Continue saline irrigations and antihistamine.  She is requesting ENT follow up with a [facility] provider in [facility]       Lumbar radiculitis    Chronic.  Initially  treated for trochanteric bursitis in 2017 by Dr Yves Dill with only transient relief.  MRI was done in late  October showing minimal DDD without neural impingement, and minimal facet arthritis.  Encouraged to lose weight and consider PT for instructions in exercises to strengthen  lower back.       Obesity (BMI 30-39.9)    I have addressed  BMI and recommended wt loss of 10% of body weight over the next 6 months using a low glycemic index diet and regular exercise a minimum of 5 days per week.  Her current pain complaint  Of leg pain appears to have improved, but she has not started exercising yet.       Prediabetes    Her  random glucose is not  elevated but her A1c has risen.  she remains at risk for developing diabetes.  I recommend she follow a low glycemic index diet and particpate regularly in an aerobic  exercise activity.  We should check an A1c and fasting lipids every  6 months.   Lab Results  Component Value Date   HGBA1C 6.3 02/16/2017   Lab Results  Component Value Date   MICROALBUR <0.7 11/14/2016          Relevant Orders   Comprehensive metabolic panel (Completed)   Hemoglobin A1c (Completed)     A total of 25 minutes of face to face time was spent with patient more than half of which was spent in counselling about the above mentioned conditions  and coordination of care    I am having Kerrilynn Langner start on mometasone. I am also having her maintain her multivitamin with minerals and losartan-hydrochlorothiazide.  Meds ordered this encounter  Medications  . mometasone (NASONEX) 50 MCG/ACT nasal spray    Sig: Place 2 sprays into the nose daily.    Dispense:  17 g    Refill:  12    There are no discontinued medications.  Follow-up: No Follow-up on file.   Sherlene Shams, MD

## 2017-02-16 NOTE — Telephone Encounter (Signed)
Copied from CRM [id number]. Topic: General - Other >> Feb 16, 2017  4:37 PM Raquel SarnaHayes, Teresa G wrote: Pt saw doctor today.  Her Lexapro - 10 mg was not sent in to pharmacy.  [facility] - [phone]

## 2017-02-18 ENCOUNTER — Encounter: Payer: Self-pay | Admitting: Internal Medicine

## 2017-02-18 DIAGNOSIS — J683 Other acute and subacute respiratory conditions due to chemicals, gases, fumes and vapors: Secondary | ICD-10-CM | POA: Insufficient documentation

## 2017-02-18 DIAGNOSIS — R05 Cough: Secondary | ICD-10-CM | POA: Insufficient documentation

## 2017-02-18 NOTE — Assessment & Plan Note (Signed)
She has had some improvement with treatment of allergic rhinitis but stopped using flonase due to cost.  Prescribing nasonex as an alternative,  Continue saline irrigations and antihistamine.  She is requesting ENT follow up with a [facility] provider in [place]

## 2017-02-18 NOTE — Assessment & Plan Note (Addendum)
Her  random glucose is not  elevated but her A1c has risen.  she remains at risk for developing diabetes.  I recommend she follow a low glycemic index diet and particpate regularly in an aerobic  exercise activity.  We should check an A1c and fasting lipids every  6 months.   Lab Results  Component Value Date   HGBA1C 6.3 02/16/2017   Lab Results  Component Value Date   MICROALBUR <0.7 11/14/2016

## 2017-02-18 NOTE — Assessment & Plan Note (Addendum)
I have addressed  BMI and recommended wt loss of 10% of body weight over the next 6 months using a low glycemic index diet and regular exercise a minimum of 5 days per week.  Her current pain complaint  Of leg pain appears to have improved, but she has not started exercising yet.

## 2017-02-18 NOTE — Assessment & Plan Note (Signed)
Chronic.  Initially treated for trochanteric bursitis in 2017 by Dr Yves Dillhasnis with only transient relief.  MRI was done in late  October showing minimal DDD without neural impingement, and minimal facet arthritis.  Encouraged to lose weight and consider PT for instructions in exercises to strengthen  lower back.

## 2017-03-29 ENCOUNTER — Encounter: Payer: Self-pay | Admitting: Internal Medicine

## 2017-04-03 ENCOUNTER — Encounter: Payer: Self-pay | Admitting: Internal Medicine

## 2017-04-04 NOTE — Telephone Encounter (Signed)
The patient has been notified and scheduled with the NP.

## 2017-04-06 ENCOUNTER — Ambulatory Visit (INDEPENDENT_AMBULATORY_CARE_PROVIDER_SITE_OTHER): Payer: Self-pay | Admitting: Family Medicine

## 2017-04-06 ENCOUNTER — Encounter: Payer: Self-pay | Admitting: Family Medicine

## 2017-04-06 VITALS — BP 122/84 | HR 88 | Temp 99.0°F | Resp 16 | Wt 210.4 lb

## 2017-04-06 DIAGNOSIS — M549 Dorsalgia, unspecified: Secondary | ICD-10-CM

## 2017-04-06 MED ORDER — MELOXICAM 7.5 MG PO TABS: 7.5000 mg | ORAL_TABLET | Freq: Every day | ORAL | 0 refills | Status: DC

## 2017-04-06 MED ORDER — CYCLOBENZAPRINE HCL 10 MG PO TABS: 10.0000 mg | ORAL_TABLET | Freq: Three times a day (TID) | ORAL | 0 refills | Status: DC | PRN
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
## 2017-04-06 NOTE — Patient Instructions (Signed)
Please take medication with food as directed. The muscle relaxer can make you sleepy so be aware that you should not drive while taking this medication. If symptoms do not improve with treatment, please follow up with your provider for further evaluation and treatment.   Back Pain, Adult Back pain is very common. The pain often gets better over time. The cause of back pain is usually not dangerous. Most people can learn to manage their back pain on their own. Follow these instructions at home: Watch your back pain for any changes. The following actions may help to lessen any pain you are feeling:  Stay active. Start with short walks on flat ground if you can. Try to walk farther each day.  Exercise regularly as told by your doctor. Exercise helps your back heal faster. It also helps avoid future injury by keeping your muscles strong and flexible.  Do not sit, drive, or stand in one place for more than 30 minutes.  Do not stay in bed. Resting more than 1-2 days can slow down your recovery.  Be careful when you bend or lift an object. Use good form when lifting: ? Bend at your knees. ? Keep the object close to your body. ? Do not twist.  Sleep on a firm mattress. Lie on your side, and bend your knees. If you lie on your back, put a pillow under your knees.  Take medicines only as told by your doctor.  Put ice on the injured area. ? Put ice in a plastic bag. ? Place a towel between your skin and the bag. ? Leave the ice on for 20 minutes, 2-3 times a day for the first 2-3 days. After that, you can switch between ice and heat packs.  Avoid feeling anxious or stressed. Find good ways to deal with stress, such as exercise.  Maintain a healthy weight. Extra weight puts stress on your back.  Contact a doctor if:  You have pain that does not go away with rest or medicine.  You have worsening pain that goes down into your legs or buttocks.  You have pain that does not get better in one  week.  You have pain at night.  You lose weight.  You have a fever or chills. Get help right away if:  You cannot control when you poop (bowel movement) or pee (urinate).  Your arms or legs feel weak.  Your arms or legs lose feeling (numbness).  You feel sick to your stomach (nauseous) or throw up (vomit).  You have belly (abdominal) pain.  You feel like you may pass out (faint). This information is not intended to replace advice given to you by your health care provider. Make sure you discuss any questions you have with your health care provider. Document Released: 07/20/2007 Document Revised: 07/09/2015 Document Reviewed: 06/04/2013 Elsevier Interactive Patient Education  [employer]2018 [employer]

## 2017-04-21 ENCOUNTER — Encounter: Payer: Self-pay | Admitting: Internal Medicine

## 2017-05-01 ENCOUNTER — Other Ambulatory Visit: Payer: Self-pay | Admitting: Internal Medicine

## 2017-05-01 MED ORDER — BENZONATATE 200 MG PO CAPS: 200.0000 mg | ORAL_CAPSULE | Freq: Three times a day (TID) | ORAL | 1 refills | Status: DC | PRN

## 2017-06-18 ENCOUNTER — Encounter: Payer: Self-pay | Admitting: Internal Medicine

## 2017-06-19 MED ORDER — LOSARTAN POTASSIUM-HCTZ 100-25 MG PO TABS: 1.0000 | ORAL_TABLET | Freq: Every day | ORAL | 1 refills | Status: DC

## 2017-06-27 ENCOUNTER — Encounter: Payer: Self-pay | Admitting: Internal Medicine

## 2017-06-28 ENCOUNTER — Telehealth: Payer: Self-pay | Admitting: Internal Medicine

## 2017-06-28 NOTE — Telephone Encounter (Signed)
Spoke with pt and she stated that Friday would work best for her so she has been doubled book for 3:30pm. Pt is aware of appt date and time.

## 2017-06-28 NOTE — Telephone Encounter (Signed)
Please work her in this week for cough ok to double book anywhere BUT NOT 12:00 on Thursday

## 2017-06-30 ENCOUNTER — Ambulatory Visit: Payer: BC Managed Care – PPO | Admitting: Internal Medicine

## 2017-06-30 ENCOUNTER — Ambulatory Visit (INDEPENDENT_AMBULATORY_CARE_PROVIDER_SITE_OTHER): Payer: BC Managed Care – PPO

## 2017-06-30 ENCOUNTER — Encounter: Payer: Self-pay | Admitting: Internal Medicine

## 2017-06-30 VITALS — BP 122/86 | HR 86 | Temp 98.8°F | Resp 15 | Ht 63.0 in | Wt 214.2 lb

## 2017-06-30 DIAGNOSIS — R05 Cough: Secondary | ICD-10-CM

## 2017-06-30 DIAGNOSIS — R058 Other specified cough: Secondary | ICD-10-CM

## 2017-06-30 DIAGNOSIS — J40 Bronchitis, not specified as acute or chronic: Secondary | ICD-10-CM

## 2017-06-30 MED ORDER — PREDNISONE 10 MG PO TABS: ORAL_TABLET | ORAL | 0 refills | Status: DC

## 2017-06-30 MED ORDER — GUAIFENESIN-CODEINE 100-10 MG/5ML PO SYRP: 5.0000 mL | ORAL_SOLUTION | Freq: Three times a day (TID) | ORAL | 0 refills | Status: DC | PRN

## 2017-06-30 MED ORDER — HYDROCOD POLST-CPM POLST ER 10-8 MG/5ML PO SUER: 5.0000 mL | Freq: Every evening | ORAL | 0 refills | Status: DC | PRN

## 2017-06-30 NOTE — Progress Notes (Signed)
Subjective:  Patient ID: Victoria Hardin, female    DOB: May 12, 1965  Age: 52 y.o. MRN: [id number]  CC: The primary encounter diagnosis was Cough with expectoration. A diagnosis of Bronchitis with tracheitis was also pertinent to this visit.  HPI [name] presents for   Persistent  dry cough that has been present for  2 weeks   . Started as productive accompanied by sore throa but no fevers.   Ears feel full,  Sinus cook mucinex dm.    Takes zytec daily .  Has daily exposure to children   Outpatient Medications Prior to Visit  Medication Sig Dispense Refill  . benzonatate (TESSALON) 200 MG capsule Take 1 capsule (200 mg total) by mouth 3 (three) times daily as needed for cough. 60 capsule 1  . cyclobenzaprine (FLEXERIL) 10 MG tablet Take 1 tablet (10 mg total) by mouth 3 (three) times daily as needed for muscle spasms. 30 tablet 0  . escitalopram (LEXAPRO) 10 MG tablet Take 1 tablet (10 mg total) by mouth daily. 30 tablet 5  . losartan-hydrochlorothiazide (HYZAAR) 100-25 MG tablet Take 1 tablet by mouth daily. 90 tablet 1  . mometasone (NASONEX) 50 MCG/ACT nasal spray Place 2 sprays into the nose daily. 17 g 12  . Multiple Vitamins-Minerals (MULTIVITAMIN WITH MINERALS) tablet Take 1 tablet by mouth daily.    . meloxicam (MOBIC) 7.5 MG tablet Take 1 tablet (7.5 mg total) by mouth daily. (Patient not taking: Reported on 06/30/2017) 10 tablet 0   No facility-administered medications prior to visit.     Review of Systems;  Patient denies headache, fevers, malaise, unintentional weight loss, skin rash, eye pain, sinus congestion and sinus pain, sore throat, dysphagia,  hemoptysis , cough, dyspnea, wheezing, chest pain, palpitations, orthopnea, edema, abdominal pain, nausea, melena, diarrhea, constipation, flank pain, dysuria, hematuria, urinary  Frequency, nocturia, numbness, tingling, seizures,  Focal weakness, Loss of consciousness,  Tremor, insomnia, depression, anxiety, and suicidal  ideation.      Objective:  BP 122/86 (BP Location: Left Arm, Patient Position: Sitting, Cuff Size: Large)   Pulse 86   Temp 98.8 F (37.1 C) (Oral)   Resp 15   Ht  (1.6 m)   Wt 214 lb 3.2 oz (97.2 kg)   SpO2 97%   BMI 37.94 kg/m   BP Readings from Last 3 Encounters:  06/30/17 122/86  04/06/17 122/84  02/16/17 110/80    Wt Readings from Last 3 Encounters:  06/30/17 214 lb 3.2 oz (97.2 kg)  04/06/17 210 lb 6 oz (95.4 kg)  02/16/17 210 lb 3.2 oz (95.3 kg)    General appearance: alert, cooperative and appears stated age Ears: normal TM's and external ear canals both ears Throat: lips, mucosa, and tongue normal; teeth and gums normal Neck: no adenopathy, no carotid bruit, supple, symmetrical, trachea midline and thyroid not enlarged, symmetric, no tenderness/mass/nodules Back: symmetric, no curvature. ROM normal. No CVA tenderness. Lungs: clear to auscultation bilaterally Heart: regular rate and rhythm, S1, S2 normal, no murmur, click, rub or gallop Abdomen: soft, non-tender; bowel sounds normal; no masses,  no organomegaly Pulses: 2+ and symmetric Skin: Skin color, texture, turgor normal. No rashes or lesions Lymph nodes: Cervical, supraclavicular, and axillary nodes normal.  Lab Results  Component Value Date   HGBA1C 6.3 02/16/2017   HGBA1C 5.9 11/14/2016   HGBA1C 6.1 06/06/2016    Lab Results  Component Value Date   CREATININE 0.79 02/16/2017   CREATININE 0.74 11/14/2016   CREATININE 0.95 06/06/2016  Lab Results  Component Value Date   WBC 10.9 (H) 11/14/2016   HGB 12.2 11/14/2016   HCT 38.0 11/14/2016   PLT 247.0 11/14/2016   GLUCOSE 84 02/16/2017   CHOL 194 02/16/2017   TRIG 97.0 02/16/2017   HDL 48.30 02/16/2017   LDLDIRECT 119.0 11/14/2016   LDLCALC 126 (H) 02/16/2017   ALT 12 02/16/2017   AST 15 02/16/2017   NA 138 02/16/2017   K 3.5 02/16/2017   CL 101 02/16/2017   CREATININE 0.79 02/16/2017   BUN 15 02/16/2017   CO2 31 02/16/2017    TSH 0.99 06/06/2016   HGBA1C 6.3 02/16/2017   MICROALBUR <0.7 11/14/2016    Assessment & Plan:   Problem List Items Addressed This Visit    Bronchitis with tracheitis    This URI is most likely bacterial  given the failure of mild HEENT  symptoms to resolve after 2 weeks and he abnormal lung x ray .  Empiric antibiotics,  Prednisone , and cough suppressant prescribed.        Other Visit Diagnoses    Cough with expectoration    -  Primary   Relevant Orders   DG Chest 2 View (Completed)    A total of 25 minutes was spent with patient more than half of which was spent in counseling patient on the above mentioned issues , reviewing and explaining recent labs and imaging studies done, and coordination of care.   I have discontinued Dauna Armenti's meloxicam. I am also having her start on predniSONE, chlorpheniramine-HYDROcodone, and azithromycin. Additionally, I am having her maintain her multivitamin with minerals, mometasone, escitalopram, cyclobenzaprine, benzonatate, losartan-hydrochlorothiazide, and guaiFENesin-codeine.  Meds ordered this encounter  Medications  . guaiFENesin-codeine (CHERATUSSIN AC) 100-10 MG/5ML syrup    Sig: Take 5 mLs by mouth 3 (three) times daily as needed for cough.    Dispense:  120 mL    Refill:  0  . predniSONE (DELTASONE) 10 MG tablet    Sig: 6 tablets on Day 1 , then reduce by 1 tablet daily until gone    Dispense:  21 tablet    Refill:  0  . chlorpheniramine-HYDROcodone (TUSSIONEX PENNKINETIC ER) 10-8 MG/5ML SUER    Sig: Take 5 mLs by mouth at bedtime as needed for cough.    Dispense:  140 mL    Refill:  0  . azithromycin (ZITHROMAX) 500 MG tablet    Sig: Take 1 tablet (500 mg total) by mouth daily.    Dispense:  7 tablet    Refill:  0    Medications Discontinued During This Encounter  Medication Reason  . meloxicam (MOBIC) 7.5 MG tablet Patient has not taken in last 30 days    Follow-up: No follow-ups on file.   Sherlene Shams, MD

## 2017-06-30 NOTE — Patient Instructions (Signed)
You have a viral syndrome which is causing bronchitis.    The post nasal drip iscontributing to  your  Cough.  I am prescribing a prednisone taper to manage the inflammation in your bronchial tubes   I also advise use of the following OTC meds to help with your other symptoms.   Take generic OTC benadryl 25 mg  At bedtime for the drainage,, or every 8 hours if you can tolerate it. You can add  sudafed PE up to 30 mg every 6 hours as needed for congestion     cheratussin for daytime cough.  Tussionex  For severe nighttime cough   flush your sinuses once daily with [facility] [facility]'s sinus rinse ;  It is a stong sinus "flush" using water and medicated salts.  Do it over the sink because it can be a bit messy

## 2017-07-02 DIAGNOSIS — J40 Bronchitis, not specified as acute or chronic: Secondary | ICD-10-CM | POA: Insufficient documentation

## 2017-07-02 MED ORDER — AZITHROMYCIN 500 MG PO TABS: 500.0000 mg | ORAL_TABLET | Freq: Every day | ORAL | 0 refills | Status: DC

## 2017-07-02 NOTE — Assessment & Plan Note (Addendum)
This URI is most likely bacterial  given the failure of mild HEENT  symptoms to resolve after 2 weeks and he abnormal lung x ray .  Empiric antibiotics,  Prednisone , and cough suppressant prescribed.

## 2017-07-30 ENCOUNTER — Encounter: Payer: Self-pay | Admitting: Internal Medicine

## 2017-08-11 ENCOUNTER — Encounter: Payer: Self-pay | Admitting: Internal Medicine

## 2017-08-11 ENCOUNTER — Ambulatory Visit: Payer: BC Managed Care – PPO | Admitting: Internal Medicine

## 2017-08-11 DIAGNOSIS — R059 Cough, unspecified: Secondary | ICD-10-CM

## 2017-08-11 DIAGNOSIS — R05 Cough: Secondary | ICD-10-CM | POA: Diagnosis not present

## 2017-08-11 DIAGNOSIS — E669 Obesity, unspecified: Secondary | ICD-10-CM

## 2017-08-11 MED ORDER — IPRATROPIUM BROMIDE 0.03 % NA SOLN: 2.0000 | Freq: Two times a day (BID) | NASAL | 12 refills | Status: DC

## 2017-08-11 NOTE — Patient Instructions (Addendum)
Suspend zyrtec and  Nasonex     Continue benadryl at night  Adding Atrovent nasal spray  2 squirts  on each side every 12 hours  For 'VASOMOTOR RHINITIS"  gargle with salt water for the laryngitis   If no improvement in 2 weeks,  Call to request and ENT referral     Return for fasting labs in 1 month, and schedule your CPE in 3 months     Intermittent  Fasting from  7 pm to 11 am has helped  a lot of diabetic and prediabetics lose weight.  Two meals between 11 am and 7 pm

## 2017-08-11 NOTE — Progress Notes (Signed)
Subjective:  Patient ID: Victoria Hardin, female    DOB: March 18, 1965  Age: 52 y.o. MRN: [id number]  CC: Diagnoses of Cough and Obesity (BMI 30-39.9) were pertinent to this visit.  HPI Victoria Hardin presents for treatment of productive cough that has been present for the past 2 weeks   Treated may 17 for bronchitis with empiric antibiotics and steroids.  States that her symptoms of cough never completely resolved.  States that the right frontal sinus continues to drain , without pain or purulent drainage.  Denies ear pain, fevers and  sore throat but voice has become hoarse,  Symptoms are present daytime only,  butshe is  taking benadry At night around 8:30 (in bed at 10:00) .   2) leg cramps:  She has been taking OTC  Magnesium Oxide for leg cramps with good results .    3) Obesity  She has not started exercising yet and has gained 2 lbs since her last visit.  She continues to have low back pain which discourages her from walking   Outpatient Medications Prior to Visit  Medication Sig Dispense Refill  . escitalopram (LEXAPRO) 10 MG tablet Take 1 tablet (10 mg total) by mouth daily. 30 tablet 5  . losartan-hydrochlorothiazide (HYZAAR) 100-25 MG tablet Take 1 tablet by mouth daily. 90 tablet 1  . Multiple Vitamins-Minerals (MULTIVITAMIN WITH MINERALS) tablet Take 1 tablet by mouth daily.    . mometasone (NASONEX) 50 MCG/ACT nasal spray Place 2 sprays into the nose daily. 17 g 12  . azithromycin (ZITHROMAX) 500 MG tablet Take 1 tablet (500 mg total) by mouth daily. (Patient not taking: Reported on 08/11/2017) 7 tablet 0  . benzonatate (TESSALON) 200 MG capsule Take 1 capsule (200 mg total) by mouth 3 (three) times daily as needed for cough. (Patient not taking: Reported on 08/11/2017) 60 capsule 1  . chlorpheniramine-HYDROcodone (TUSSIONEX PENNKINETIC ER) 10-8 MG/5ML SUER Take 5 mLs by mouth at bedtime as needed for cough. (Patient not taking: Reported on 08/11/2017) 140 mL 0  . cyclobenzaprine  (FLEXERIL) 10 MG tablet Take 1 tablet (10 mg total) by mouth 3 (three) times daily as needed for muscle spasms. (Patient not taking: Reported on 08/11/2017) 30 tablet 0  . guaiFENesin-codeine (CHERATUSSIN AC) 100-10 MG/5ML syrup Take 5 mLs by mouth 3 (three) times daily as needed for cough. (Patient not taking: Reported on 08/11/2017) 120 mL 0  . predniSONE (DELTASONE) 10 MG tablet 6 tablets on Day 1 , then reduce by 1 tablet daily until gone (Patient not taking: Reported on 08/11/2017) 21 tablet 0   No facility-administered medications prior to visit.     Review of Systems;  Patient denies headache, fevers, malaise, unintentional weight loss, skin rash, eye pain, sinus congestion and sinus pain, sore throat, dysphagia,  hemoptysis , cough, dyspnea, wheezing, chest pain, palpitations, orthopnea, edema, abdominal pain, nausea, melena, diarrhea, constipation, flank pain, dysuria, hematuria, urinary  Frequency, nocturia, numbness, tingling, seizures,  Focal weakness, Loss of consciousness,  Tremor, insomnia, depression, anxiety, and suicidal ideation.      Objective:  BP 110/72 (BP Location: Left Arm, Patient Position: Sitting, Cuff Size: Large)   Pulse 71   Temp 98.6 F (37 C) (Oral)   Resp 15   Ht 5\' 3"  (1.6 m)   Wt 216 lb (98 kg)   SpO2 98%   BMI 38.26 kg/m   BP Readings from Last 3 Encounters:  08/11/17 110/72  06/30/17 122/86  04/06/17 122/84    Wt Readings from  Last 3 Encounters:  08/11/17 216 lb (98 kg)  06/30/17 214 lb 3.2 oz (97.2 kg)  04/06/17 210 lb 6 oz (95.4 kg)    General appearance: alert, cooperative and appears stated age Ears: normal TM's and external ear canals both ears Throat: lips, mucosa, and tongue normal; teeth and gums normal Neck: no adenopathy, no carotid bruit, supple, symmetrical, trachea midline and thyroid not enlarged, symmetric, no tenderness/mass/nodules Back: symmetric, no curvature. ROM normal. No CVA tenderness. Lungs: clear to auscultation  bilaterally Heart: regular rate and rhythm, S1, S2 normal, no murmur, click, rub or gallop Abdomen: soft, non-tender; bowel sounds normal; no masses,  no organomegaly Pulses: 2+ and symmetric Skin: Skin color, texture, turgor normal. No rashes or lesions Lymph nodes: Cervical, supraclavicular, and axillary nodes normal.  Lab Results  Component Value Date   HGBA1C 6.3 02/16/2017   HGBA1C 5.9 11/14/2016   HGBA1C 6.1 06/06/2016    Lab Results  Component Value Date   CREATININE 0.79 02/16/2017   CREATININE 0.74 11/14/2016   CREATININE 0.95 06/06/2016    Lab Results  Component Value Date   WBC 10.9 (H) 11/14/2016   HGB 12.2 11/14/2016   HCT 38.0 11/14/2016   PLT 247.0 11/14/2016   GLUCOSE 84 02/16/2017   CHOL 194 02/16/2017   TRIG 97.0 02/16/2017   HDL 48.30 02/16/2017   LDLDIRECT 119.0 11/14/2016   LDLCALC 126 (H) 02/16/2017   ALT 12 02/16/2017   AST 15 02/16/2017   NA 138 02/16/2017   K 3.5 02/16/2017   CL 101 02/16/2017   CREATININE 0.79 02/16/2017   BUN 15 02/16/2017   CO2 31 02/16/2017   TSH 0.99 06/06/2016   HGBA1C 6.3 02/16/2017   MICROALBUR <0.7 11/14/2016     Assessment & Plan:   Problem List Items Addressed This Visit    Obesity (BMI 30-39.9)    Now with prediabetes.  I have addressed  BMI and recommended wt loss of 10% of body weight over the next 6 months using a low glycemic index diet, intermittent fasting  and regular exercise a minimum of 5 days per week.        Cough    History suggestive of vasomotor rhinitis .  Has not responded to antibiotics,  Steroids and antihistmaines.  Trial of Atrovent . If no improvement in 2 weeks will refer to ENT for evaluation         A total of 25 minutes of face to face time was spent with patient more than half of which was spent in counselling about the above mentioned conditions  and coordination of care   I have discontinued Gittel Schleifer's mometasone, cyclobenzaprine, benzonatate, guaiFENesin-codeine,  predniSONE, chlorpheniramine-HYDROcodone, and azithromycin. I am also having her start on ipratropium. Additionally, I am having her maintain her multivitamin with minerals, escitalopram, and losartan-hydrochlorothiazide.  Meds ordered this encounter  Medications  . ipratropium (ATROVENT) 0.03 % nasal spray    Sig: Place 2 sprays into both nostrils every 12 (twelve) hours.    Dispense:  30 mL    Refill:  12    Medications Discontinued During This Encounter  Medication Reason  . azithromycin (ZITHROMAX) 500 MG tablet Completed Course  . benzonatate (TESSALON) 200 MG capsule Completed Course  . chlorpheniramine-HYDROcodone (TUSSIONEX PENNKINETIC ER) 10-8 MG/5ML SUER Completed Course  . cyclobenzaprine (FLEXERIL) 10 MG tablet Patient has not taken in last 30 days  . guaiFENesin-codeine (CHERATUSSIN AC) 100-10 MG/5ML syrup Completed Course  . predniSONE (DELTASONE) 10 MG tablet Completed Course  .  mometasone (NASONEX) 50 MCG/ACT nasal spray     Follow-up: No follow-ups on file.   Sherlene Shamseresa L , MD

## 2017-08-12 NOTE — Assessment & Plan Note (Signed)
Now with prediabetes.  I have addressed  BMI and recommended wt loss of 10% of body weight over the next 6 months using a low glycemic index diet, intermittent fasting  and regular exercise a minimum of 5 days per week.

## 2017-08-12 NOTE — Assessment & Plan Note (Signed)
History suggestive of vasomotor rhinitis .  Has not responded to antibiotics,  Steroids and antihistmaines.  Trial of Atrovent . If no improvement in 2 weeks will refer to ENT for evaluation

## 2017-09-11 ENCOUNTER — Other Ambulatory Visit: Payer: Self-pay | Admitting: Internal Medicine

## 2017-09-11 ENCOUNTER — Other Ambulatory Visit (INDEPENDENT_AMBULATORY_CARE_PROVIDER_SITE_OTHER): Payer: BC Managed Care – PPO

## 2017-09-11 DIAGNOSIS — E611 Iron deficiency: Secondary | ICD-10-CM | POA: Diagnosis not present

## 2017-09-11 DIAGNOSIS — R7303 Prediabetes: Secondary | ICD-10-CM

## 2017-09-11 DIAGNOSIS — J310 Chronic rhinitis: Secondary | ICD-10-CM

## 2017-09-11 LAB — CBC WITH DIFFERENTIAL/PLATELET
Basophils Absolute: 0 10*3/uL (ref 0.0–0.1)
Basophils Relative: 0.4 % (ref 0.0–3.0)
EOS PCT: 1.1 % (ref 0.0–5.0)
Eosinophils Absolute: 0.1 10*3/uL (ref 0.0–0.7)
HCT: 36.8 % (ref 36.0–46.0)
HEMOGLOBIN: 12 g/dL (ref 12.0–15.0)
Lymphocytes Relative: 24.7 % (ref 12.0–46.0)
Lymphs Abs: 1.6 10*3/uL (ref 0.7–4.0)
MCHC: 32.5 g/dL (ref 30.0–36.0)
MCV: 83.3 fl (ref 78.0–100.0)
MONOS PCT: 4.8 % (ref 3.0–12.0)
Monocytes Absolute: 0.3 10*3/uL (ref 0.1–1.0)
Neutro Abs: 4.6 10*3/uL (ref 1.4–7.7)
Neutrophils Relative %: 69 % (ref 43.0–77.0)
Platelets: 211 10*3/uL (ref 150.0–400.0)
RBC: 4.42 Mil/uL (ref 3.87–5.11)
RDW: 15.4 % (ref 11.5–15.5)
WBC: 6.7 10*3/uL (ref 4.0–10.5)

## 2017-09-11 LAB — IRON,TIBC AND FERRITIN PANEL
%SAT: 21 % (calc) (ref 16–45)
FERRITIN: 44 ng/mL (ref 16–232)
Iron: 63 ug/dL (ref 45–160)
TIBC: 297 mcg/dL (calc) (ref 250–450)

## 2017-09-11 LAB — COMPREHENSIVE METABOLIC PANEL
ALBUMIN: 3.8 g/dL (ref 3.5–5.2)
ALT: 9 U/L (ref 0–35)
AST: 11 U/L (ref 0–37)
Alkaline Phosphatase: 84 U/L (ref 39–117)
BUN: 14 mg/dL (ref 6–23)
CO2: 28 mEq/L (ref 19–32)
Calcium: 9.6 mg/dL (ref 8.4–10.5)
Chloride: 105 mEq/L (ref 96–112)
Creatinine, Ser: 0.92 mg/dL (ref 0.40–1.20)
GFR: 82.07 mL/min (ref >60.00)
Glucose, Bld: 93 mg/dL (ref 70–99)
POTASSIUM: 3.7 meq/L (ref 3.5–5.1)
Sodium: 141 mEq/L (ref 135–145)
Total Bilirubin: 0.4 mg/dL (ref 0.2–1.2)
Total Protein: 7.4 g/dL (ref 6.0–8.3)

## 2017-09-11 LAB — LIPID PANEL
CHOLESTEROL: 190 mg/dL (ref 0–200)
HDL: 47.6 mg/dL (ref >39.00)
LDL CALC: 127 mg/dL — AB (ref 0–99)
NonHDL: 141.93
Total CHOL/HDL Ratio: 4
Triglycerides: 74 mg/dL (ref 0.0–149.0)
VLDL: 14.8 mg/dL (ref 0.0–40.0)

## 2017-09-11 LAB — HEMOGLOBIN A1C: HEMOGLOBIN A1C: 6.2 % (ref 4.6–6.5)

## 2017-09-11 NOTE — Addendum Note (Signed)
Addended by: Warden FillersWRIGHT, LATOYA S on: 09/11/2017 08:52 AM   Modules accepted: Orders

## 2017-11-13 ENCOUNTER — Ambulatory Visit (INDEPENDENT_AMBULATORY_CARE_PROVIDER_SITE_OTHER): Payer: BC Managed Care – PPO | Admitting: Internal Medicine

## 2017-11-13 ENCOUNTER — Encounter: Payer: Self-pay | Admitting: Internal Medicine

## 2017-11-13 VITALS — BP 104/76 | HR 80 | Temp 98.9°F | Resp 15 | Ht 63.0 in | Wt 212.2 lb

## 2017-11-13 DIAGNOSIS — N951 Menopausal and female climacteric states: Secondary | ICD-10-CM

## 2017-11-13 DIAGNOSIS — Z0000 Encounter for general adult medical examination without abnormal findings: Secondary | ICD-10-CM | POA: Diagnosis not present

## 2017-11-13 DIAGNOSIS — I10 Essential (primary) hypertension: Secondary | ICD-10-CM

## 2017-11-13 DIAGNOSIS — Z1231 Encounter for screening mammogram for malignant neoplasm of breast: Secondary | ICD-10-CM

## 2017-11-13 DIAGNOSIS — R7303 Prediabetes: Secondary | ICD-10-CM

## 2017-11-13 DIAGNOSIS — Z1239 Encounter for other screening for malignant neoplasm of breast: Secondary | ICD-10-CM

## 2017-11-13 MED ORDER — ESCITALOPRAM OXALATE 10 MG PO TABS: 10.0000 mg | ORAL_TABLET | Freq: Every day | ORAL | 1 refills | Status: DC

## 2017-11-13 NOTE — Progress Notes (Signed)
Patient ID: Victoria Hardin, female    DOB: 1965-12-31  Age: 52 y.o. MRN: [id number]  The patient is here for annual  Preventive examination and management of other chronic and acute problems.  PAP smear due but deferred by patient today .  Last mammogram 2015.    The risk factors are reflected in the social history.  The roster of all physicians providing medical care to patient - is listed in the Snapshot section of the chart.  Activities of daily living:  The patient is 100% independent in all ADLs: dressing, toileting, feeding as well as independent mobility  Home safety : The patient has smoke detectors in the home. They wear seatbelts.  There are no firearms at home. There is no violence in the home.   There is no risks for hepatitis, STDs or HIV. There is no   history of blood transfusion. They have no travel history to infectious disease endemic areas of the world.  The patient has seen their dentist in the last six month. They have seen their eye doctor in the last year. They deny  hearing difficulty with regard to whispered voices and some television programs.  They have deferred audiologic testing in the last year.  They do not  have excessive sun exposure. Discussed the need for sun protection: hats, long sleeves and use of sunscreen if there is significant sun exposure.   Diet: the importance of a healthy diet is discussed. She has lost 4 lbs on the intermittent fasting diet .   The benefits of regular aerobic exercise were discussed. She walks 4 times per week ,  30 minutes.   Depression screen: there are no signs or vegative symptoms of depression- irritability, change in appetite, anhedonia, sadness/tearfullness.  The following portions of the patient's history were reviewed and updated as appropriate: allergies, current medications, past family history, past medical history,  past surgical history, past social history  and problem list.  Visual acuity was not assessed per  patient preference since she has regular follow up with her ophthalmologist. Hearing and body mass index were assessed and reviewed.   During the course of the visit the patient was educated and counseled about appropriate screening and preventive services including : fall prevention , diabetes screening, nutrition counseling, colorectal cancer screening, and recommended immunizations.    CC: The primary encounter diagnosis was Breast cancer screening. Diagnoses of Encounter for preventive health examination, Hot flushes, perimenopausal, Essential hypertension, and Prediabetes were also pertinent to this visit.  1) hot flashes were better controlled on lexapro, but returned when she stopped the medication.  Wants to resume it.   2) refuses the flu vaccine   History Victoria Hardin has a past medical history of History of kidney stones and Shingles outbreak.   She has a past surgical history that includes Abdominal hysterectomy.   Her family history includes Cancer in her maternal grandfather; Cancer (age of onset: 26) in her mother; Heart disease in her maternal grandmother.She reports that she has never smoked. She has never used smokeless tobacco. She reports that she drinks about 1.0 standard drinks of alcohol per week. Her drug history is not on file.  Outpatient Medications Prior to Visit  Medication Sig Dispense Refill  . ipratropium (ATROVENT) 0.03 % nasal spray Place 2 sprays into both nostrils every 12 (twelve) hours. 30 mL 12  . losartan-hydrochlorothiazide (HYZAAR) 100-25 MG tablet Take 1 tablet by mouth daily. 90 tablet 1  . Multiple Vitamins-Minerals (MULTIVITAMIN WITH MINERALS) tablet Take  1 tablet by mouth daily.    Marland Kitchen escitalopram (LEXAPRO) 10 MG tablet Take 1 tablet (10 mg total) by mouth daily. (Patient not taking: Reported on 11/13/2017) 30 tablet 5   No facility-administered medications prior to visit.     Review of Systems   Patient denies headache, fevers, malaise,  unintentional weight loss, skin rash, eye pain, sinus congestion and sinus pain, sore throat, dysphagia,  hemoptysis , cough, dyspnea, wheezing, chest pain, palpitations, orthopnea, edema, abdominal pain, nausea, melena, diarrhea, constipation, flank pain, dysuria, hematuria, urinary  Frequency, nocturia, numbness, tingling, seizures,  Focal weakness, Loss of consciousness,  Tremor, insomnia, depression, anxiety, and suicidal ideation.      Objective:  BP 104/76 (BP Location: Left Arm, Patient Position: Sitting, Cuff Size: Large)   Pulse 80   Temp 98.9 F (37.2 C) (Oral)   Resp 15   Ht 5\' 3"  (1.6 m)   Wt 212 lb 3.2 oz (96.3 kg)   SpO2 97%   BMI 37.59 kg/m   Physical Exam   General appearance: alert, cooperative and appears stated age Head: Normocephalic, without obvious abnormality, atraumatic Eyes: conjunctivae/corneas clear. PERRL, EOM's intact. Fundi benign. Ears: normal TM's and external ear canals both ears Nose: Nares normal. Septum midline. Mucosa normal. No drainage or sinus tenderness. Throat: lips, mucosa, and tongue normal; teeth and gums normal Neck: no adenopathy, no carotid bruit, no JVD, supple, symmetrical, trachea midline and thyroid not enlarged, symmetric, no tenderness/mass/nodules Lungs: clear to auscultation bilaterally Breasts: normal appearance, no masses or tenderness Heart: regular rate and rhythm, S1, S2 normal, no murmur, click, rub or gallop Abdomen: soft, non-tender; bowel sounds normal; no masses,  no organomegaly Extremities: extremities normal, atraumatic, no cyanosis or edema Pulses: 2+ and symmetric Skin: Skin color, texture, turgor normal. No rashes or lesions Neurologic: Alert and oriented X 3, normal strength and tone. Normal symmetric reflexes. Normal coordination and gait.     Assessment & Plan:   Problem List Items Addressed This Visit    Encounter for preventive health examination    Annual comprehensive preventive exam was done as  well as an evaluation and management of chronic conditions .  During the course of the visit the patient was educated and counseled about appropriate screening and preventive services including :  diabetes screening, lipid analysis with projected  10 year  risk for CAD , nutrition counseling, breast, cervical and colorectal cancer screening, and recommended immunizations.  Printed recommendations for health maintenance screenings was give      Hot flushes, perimenopausal    Resume lexapro since it helped significantly      Hypertension    Well controlled on current regimen. Renal function stable, no changes today.  Lab Results  Component Value Date   CREATININE 0.92 09/11/2017   Lab Results  Component Value Date   NA 141 09/11/2017   K 3.7 09/11/2017   CL 105 09/11/2017   CO2 28 09/11/2017         Prediabetes    Her  random glucose is not  elevated but her A1c is in the prediabetic range.  she remains at risk for developing diabetes she is following a  low glycemic index diet and particpating regularly in an aerobic  exercise activity.  We will check an A1c and fasting lipids every  6 months.   Lab Results  Component Value Date   HGBA1C 6.2 09/11/2017   Lab Results  Component Value Date   MICROALBUR <0.7 11/14/2016  Other Visit Diagnoses    Breast cancer screening    -  Primary   Relevant Orders   MM 3D SCREEN BREAST BILATERAL      I am having Victoria Hardin maintain her multivitamin with minerals, losartan-hydrochlorothiazide, ipratropium, and escitalopram.  Meds ordered this encounter  Medications  . escitalopram (LEXAPRO) 10 MG tablet    Sig: Take 1 tablet (10 mg total) by mouth daily.    Dispense:  90 tablet    Refill:  1    Medications Discontinued During This Encounter  Medication Reason  . escitalopram (LEXAPRO) 10 MG tablet Reorder    Follow-up: Return in about 6 months (around 05/14/2018) for pap smear  labs bp check .   Sherlene Shams, MD

## 2017-11-13 NOTE — Patient Instructions (Signed)
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  I have refilled your lexapro.     Check with your pharmacy about whether your particular losartan has been recalled.  If you develop an illness that makes you sick on stomach, you should suspend your losartan/hct for those days that you are sick    Health Maintenance for Postmenopausal Women Menopause is a normal process in which your reproductive ability comes to an end. This process happens gradually over a span of months to years, usually between the ages of 70 and 81. Menopause is complete when you have missed 12 consecutive menstrual periods. It is important to talk with your health care provider about some of the most common conditions that affect postmenopausal women, such as heart disease, cancer, and bone loss (osteoporosis). Adopting a healthy lifestyle and getting preventive care can help to promote your health and wellness. Those actions can also lower your chances of developing some of these common conditions. What should I know about menopause? During menopause, you may experience a number of symptoms, such as:  Moderate-to-severe hot flashes.  Night sweats.  Decrease in sex drive.  Mood swings.  Headaches.  Tiredness.  Irritability.  Memory problems.  Insomnia.  Choosing to treat or not to treat menopausal changes is an individual decision that you make with your health care provider. What should I know about hormone replacement therapy and supplements? Hormone therapy products are effective for treating symptoms that are associated with menopause, such as hot flashes and night sweats. Hormone replacement carries certain risks, especially as you become older. If you are thinking about using estrogen or estrogen with progestin treatments, discuss the benefits and risks with your health care provider. What should I know about  heart disease and stroke? Heart disease, heart attack, and stroke become more likely as you age. This may be due, in part, to the hormonal changes that your body experiences during menopause. These can affect how your body processes dietary fats, triglycerides, and cholesterol. Heart attack and stroke are both medical emergencies. There are many things that you can do to help prevent heart disease and stroke:  Have your blood pressure checked at least every 1-2 years. High blood pressure causes heart disease and increases the risk of stroke.  If you are 51-53 years old, ask your health care provider if you should take aspirin to prevent a heart attack or a stroke.  Do not use any tobacco products, including cigarettes, chewing tobacco, or electronic cigarettes. If you need help quitting, ask your health care provider.  It is important to eat a healthy diet and maintain a healthy weight. ? Be sure to include plenty of vegetables, fruits, low-fat dairy products, and lean protein. ? Avoid eating foods that are high in solid fats, added sugars, or salt (sodium).  Get regular exercise. This is one of the most important things that you can do for your health. ? Try to exercise for at least 150 minutes each week. The type of exercise that you do should increase your heart rate and make you sweat. This is known as moderate-intensity exercise. ? Try to do strengthening exercises at least twice each week. Do these in addition to the moderate-intensity exercise.  Know your numbers.Ask your health care provider to check your cholesterol and your blood glucose. Continue to have your blood tested as directed by your health care provider.  What  should I know about cancer screening? There are several types of cancer. Take the following steps to reduce your risk and to catch any cancer development as early as possible. Breast Cancer  Practice breast self-awareness. ? This means understanding how your  breasts normally appear and feel. ? It also means doing regular breast self-exams. Let your health care provider know about any changes, no matter how small.  If you are 68 or older, have a clinician do a breast exam (clinical breast exam or CBE) every year. Depending on your age, family history, and medical history, it may be recommended that you also have a yearly breast X-ray (mammogram).  If you have a family history of breast cancer, talk with your health care provider about genetic screening.  If you are at high risk for breast cancer, talk with your health care provider about having an MRI and a mammogram every year.  Breast cancer (BRCA) gene test is recommended for women who have family members with BRCA-related cancers. Results of the assessment will determine the need for genetic counseling and BRCA1 and for BRCA2 testing. BRCA-related cancers include these types: ? Breast. This occurs in males or females. ? Ovarian. ? Tubal. This may also be called fallopian tube cancer. ? Cancer of the abdominal or pelvic lining (peritoneal cancer). ? Prostate. ? Pancreatic.  Cervical, Uterine, and Ovarian Cancer Your health care provider may recommend that you be screened regularly for cancer of the pelvic organs. These include your ovaries, uterus, and vagina. This screening involves a pelvic exam, which includes checking for microscopic changes to the surface of your cervix (Pap test).  For women ages 21-65, health care providers may recommend a pelvic exam and a Pap test every three years. For women ages 21-65, they may recommend the Pap test and pelvic exam, combined with testing for human papilloma virus (HPV), every five years. Some types of HPV increase your risk of cervical cancer. Testing for HPV may also be done on women of any age who have unclear Pap test results.  Other health care providers may not recommend any screening for nonpregnant women who are considered low risk for pelvic  cancer and have no symptoms. Ask your health care provider if a screening pelvic exam is right for you.  If you have had past treatment for cervical cancer or a condition that could lead to cancer, you need Pap tests and screening for cancer for at least 20 years after your treatment. If Pap tests have been discontinued for you, your risk factors (such as having a new sexual partner) need to be reassessed to determine if you should start having screenings again. Some women have medical problems that increase the chance of getting cervical cancer. In these cases, your health care provider may recommend that you have screening and Pap tests more often.  If you have a family history of uterine cancer or ovarian cancer, talk with your health care provider about genetic screening.  If you have vaginal bleeding after reaching menopause, tell your health care provider.  There are currently no reliable tests available to screen for ovarian cancer.  Lung Cancer Lung cancer screening is recommended for adults 84-17 years old who are at high risk for lung cancer because of a history of smoking. A yearly low-dose CT scan of the lungs is recommended if you:  Currently smoke.  Have a history of at least 30 pack-years of smoking and you currently smoke or have quit within the past 15  years. A pack-year is smoking an average of one pack of cigarettes per day for one year.  Yearly screening should:  Continue until it has been 15 years since you quit.  Stop if you develop a health problem that would prevent you from having lung cancer treatment.  Colorectal Cancer  This type of cancer can be detected and can often be prevented.  Routine colorectal cancer screening usually begins at age 75 and continues through age 3.  If you have risk factors for colon cancer, your health care provider may recommend that you be screened at an earlier age.  If you have a family history of colorectal cancer, talk with  your health care provider about genetic screening.  Your health care provider may also recommend using home test kits to check for hidden blood in your stool.  A small camera at the end of a tube can be used to examine your colon directly (sigmoidoscopy or colonoscopy). This is done to check for the earliest forms of colorectal cancer.  Direct examination of the colon should be repeated every 5-10 years until age 38. However, if early forms of precancerous polyps or small growths are found or if you have a family history or genetic risk for colorectal cancer, you may need to be screened more often.  Skin Cancer  Check your skin from head to toe regularly.  Monitor any moles. Be sure to tell your health care provider: ? About any new moles or changes in moles, especially if there is a change in a mole's shape or color. ? If you have a mole that is larger than the size of a pencil eraser.  If any of your family members has a history of skin cancer, especially at a young age, talk with your health care provider about genetic screening.  Always use sunscreen. Apply sunscreen liberally and repeatedly throughout the day.  Whenever you are outside, protect yourself by wearing long sleeves, pants, a wide-brimmed hat, and sunglasses.  What should I know about osteoporosis? Osteoporosis is a condition in which bone destruction happens more quickly than new bone creation. After menopause, you may be at an increased risk for osteoporosis. To help prevent osteoporosis or the bone fractures that can happen because of osteoporosis, the following is recommended:  If you are 28-43 years old, get at least 1,000 mg of calcium and at least 600 mg of vitamin D per day.  If you are older than age 72 but younger than age 78, get at least 1,200 mg of calcium and at least 600 mg of vitamin D per day.  If you are older than age 80, get at least 1,200 mg of calcium and at least 800 mg of vitamin D per  day.  Smoking and excessive alcohol intake increase the risk of osteoporosis. Eat foods that are rich in calcium and vitamin D, and do weight-bearing exercises several times each week as directed by your health care provider. What should I know about how menopause affects my mental health? Depression may occur at any age, but it is more common as you become older. Common symptoms of depression include:  Low or sad mood.  Changes in sleep patterns.  Changes in appetite or eating patterns.  Feeling an overall lack of motivation or enjoyment of activities that you previously enjoyed.  Frequent crying spells.  Talk with your health care provider if you think that you are experiencing depression. What should I know about immunizations? It is important that  you get and maintain your immunizations. These include:  Tetanus, diphtheria, and pertussis (Tdap) booster vaccine.  Influenza every year before the flu season begins.  Pneumonia vaccine.  Shingles vaccine.  Your health care provider may also recommend other immunizations. This information is not intended to replace advice given to you by your health care provider. Make sure you discuss any questions you have with your health care provider. Document Released: 03/25/2005 Document Revised: 08/21/2015 Document Reviewed: 11/04/2014 Elsevier Interactive Patient Education  2018 [employer].

## 2017-11-14 NOTE — Assessment & Plan Note (Signed)
Resume lexapro since it helped significantly

## 2017-11-14 NOTE — Assessment & Plan Note (Signed)
Well controlled on current regimen. Renal function stable, no changes today.  Lab Results  Component Value Date   CREATININE 0.92 09/11/2017   Lab Results  Component Value Date   NA 141 09/11/2017   K 3.7 09/11/2017   CL 105 09/11/2017   CO2 28 09/11/2017

## 2017-11-14 NOTE — Assessment & Plan Note (Signed)
Her  random glucose is not  elevated but her A1c is in the prediabetic range.  she remains at risk for developing diabetes she is following a  low glycemic index diet and particpating regularly in an aerobic  exercise activity.  We will check an A1c and fasting lipids every  6 months.   Lab Results  Component Value Date   HGBA1C 6.2 09/11/2017   Lab Results  Component Value Date   MICROALBUR <0.7 11/14/2016     

## 2017-11-14 NOTE — Assessment & Plan Note (Signed)
Annual comprehensive preventive exam was done as well as an evaluation and management of chronic conditions .  During the course of the visit the patient was educated and counseled about appropriate screening and preventive services including :  diabetes screening, lipid analysis with projected  10 year  risk for CAD , nutrition counseling, breast, cervical and colorectal cancer screening, and recommended immunizations.  Printed recommendations for health maintenance screenings was give 

## 2017-12-19 MED ORDER — LOSARTAN POTASSIUM-HCTZ 100-25 MG PO TABS: 1.0000 | ORAL_TABLET | Freq: Every day | ORAL | 1 refills | Status: DC

## 2018-01-10 ENCOUNTER — Ambulatory Visit
Admission: RE | Admit: 2018-01-10 | Discharge: 2018-01-10 | Disposition: A | Discharge status: Home / Self Care | Payer: BC Managed Care – PPO | Source: Ambulatory Visit | Attending: Internal Medicine | Admitting: Internal Medicine

## 2018-01-10 DIAGNOSIS — Z1239 Encounter for other screening for malignant neoplasm of breast: Secondary | ICD-10-CM

## 2018-01-18 ENCOUNTER — Other Ambulatory Visit: Payer: Self-pay | Admitting: Internal Medicine

## 2018-01-18 DIAGNOSIS — R928 Other abnormal and inconclusive findings on diagnostic imaging of breast: Secondary | ICD-10-CM

## 2018-01-23 ENCOUNTER — Telehealth: Payer: Self-pay

## 2018-01-23 ENCOUNTER — Telehealth: Payer: Self-pay | Admitting: Internal Medicine

## 2018-01-23 MED ORDER — LOSARTAN POTASSIUM 100 MG PO TABS: 100.0000 mg | ORAL_TABLET | Freq: Every day | ORAL | 1 refills | Status: DC

## 2018-01-23 MED ORDER — HYDROCHLOROTHIAZIDE 25 MG PO TABS: 25.0000 mg | ORAL_TABLET | Freq: Every day | ORAL | 1 refills | Status: DC

## 2018-01-23 NOTE — Telephone Encounter (Signed)
Copied from CRM [id number]. Topic: Quick Communication - See Telephone Encounter >> Jan 23, 2018  9:35 AM Arlyss Gandyichardson, Destry Dauber N, NT wrote: CRM for notification. See Telephone encounter for: 01/23/18. Pt states that she cannot get the 90 day rx for losartan-hydrochlorothiazide (HYZAAR) 100-25 MG tablet and the pharmacy only gave her a 30 day supply last month and now she is out. The pharmacy states the med is on back order. Pt would like to see if either 2 separate meds can be ordered or what the doctor advises. [facility] [address] - [place]), [place] - [address] [phone] (Phone) [phone] (Fax

## 2018-01-23 NOTE — Telephone Encounter (Signed)
Sent in the losartan and hctz separately due to the combo pill being on back order.

## 2018-01-24 ENCOUNTER — Ambulatory Visit
Admission: RE | Admit: 2018-01-24 | Discharge: 2018-01-24 | Disposition: A | Discharge status: Home / Self Care | Payer: BC Managed Care – PPO | Source: Ambulatory Visit | Attending: Internal Medicine | Admitting: Internal Medicine

## 2018-01-24 ENCOUNTER — Other Ambulatory Visit: Payer: Self-pay | Admitting: Internal Medicine

## 2018-01-24 DIAGNOSIS — R928 Other abnormal and inconclusive findings on diagnostic imaging of breast: Secondary | ICD-10-CM

## 2018-01-24 DIAGNOSIS — N6489 Other specified disorders of breast: Secondary | ICD-10-CM

## 2018-01-24 NOTE — Telephone Encounter (Signed)
This was sent in as two separate medications yesterday.

## 2018-01-29 ENCOUNTER — Other Ambulatory Visit: Payer: Self-pay | Admitting: Internal Medicine

## 2018-01-29 ENCOUNTER — Ambulatory Visit
Admission: RE | Admit: 2018-01-29 | Discharge: 2018-01-29 | Disposition: A | Discharge status: Home / Self Care | Payer: BC Managed Care – PPO | Source: Ambulatory Visit | Attending: Internal Medicine | Admitting: Internal Medicine

## 2018-01-29 ENCOUNTER — Encounter: Payer: Self-pay | Admitting: *Deleted

## 2018-01-29 DIAGNOSIS — N6489 Other specified disorders of breast: Secondary | ICD-10-CM

## 2018-01-30 ENCOUNTER — Telehealth: Payer: Self-pay | Admitting: Internal Medicine

## 2018-01-30 DIAGNOSIS — N6320 Unspecified lump in the left breast, unspecified quadrant: Secondary | ICD-10-CM

## 2018-01-30 DIAGNOSIS — N6310 Unspecified lump in the right breast, unspecified quadrant: Secondary | ICD-10-CM

## 2018-01-30 NOTE — Telephone Encounter (Signed)
Copied from CRM [phone]. Topic: Quick Communication - See Telephone Encounter >> Jan 30, 2018 11:28 AM Jolayne Hainesaylor, Brittany L wrote: CRM for notification. See Telephone encounter for: 01/30/18. Larita FifeLynn RN with [facility] called and stated that the patient had two biopsy's done on each breast. She stated that it came back that she has no cancer but is at high risk for lesions. She also stated that the patient needs to see a breast surgeon and the patient is requesting to go to [facility]. [facility] so Dr Darrick Huntsmanullo would have to put in the surgical referral. She stated that she needs a " complex sclerosing lesion " in each breast.  She has relayed the results to the patient and she verbalized understanding. She would like someone to contact the patient after the referral is place. Thanks!

## 2018-01-30 NOTE — Telephone Encounter (Signed)
Please advise 

## 2018-01-31 NOTE — Telephone Encounter (Signed)
BREAST SURGERY REFERRAL MADE FOR [facility]  SEE PATIENT NOTE

## 2018-03-27 ENCOUNTER — Ambulatory Visit: Payer: BC Managed Care – PPO | Admitting: Internal Medicine

## 2018-03-27 ENCOUNTER — Encounter: Payer: Self-pay | Admitting: Internal Medicine

## 2018-03-27 VITALS — BP 120/80 | HR 86 | Temp 98.2°F | Wt 208.0 lb

## 2018-03-27 DIAGNOSIS — N6022 Fibroadenosis of left breast: Secondary | ICD-10-CM

## 2018-03-27 DIAGNOSIS — J329 Chronic sinusitis, unspecified: Secondary | ICD-10-CM | POA: Diagnosis not present

## 2018-03-27 DIAGNOSIS — N6029 Fibroadenosis of unspecified breast: Secondary | ICD-10-CM | POA: Insufficient documentation

## 2018-03-27 MED ORDER — PREDNISONE 10 MG PO TABS: ORAL_TABLET | ORAL | 0 refills | Status: DC

## 2018-03-27 MED ORDER — AMOXICILLIN-POT CLAVULANATE 875-125 MG PO TABS: 1.0000 | ORAL_TABLET | Freq: Two times a day (BID) | ORAL | 0 refills | Status: DC

## 2018-03-27 NOTE — Assessment & Plan Note (Signed)
She has been referred to [facility] for surgical excision

## 2018-03-27 NOTE — Patient Instructions (Signed)
I am treating you for sinusitis/otitis which is a complication from your viral infection due to  persistent sinus congestion.   I am prescribing an antibiotic (augmentin) and a prednisone taper  To manage the infection and the inflammation in your ear/sinuses.   I also advise use of the following OTC meds to help with your other symptoms.   Take generic OTC benadryl 25 mg at night  for the drainage,  Sudafed PE  10 to 20 mg every 8 hours for the congestion, you may substitute Afrin nasal spray for the nighttime dose of sudafed PE  If needed to prevent insomnia.  Continue Robitussin   FOR THE COUGH.  Gargle with salt water as needed for sore throat.

## 2018-03-27 NOTE — Assessment & Plan Note (Signed)
Empiric antibiotics and prednisone described.  Navage sinus irrigant prescribed.

## 2018-03-27 NOTE — Progress Notes (Signed)
Subjective:  Patient ID: Victoria PughFelecia Cruzen, female    DOB: October 09, 1965  Age: 53 y.o. MRN: [id number]  CC: The primary encounter diagnosis was Recurrent sinusitis. A diagnosis of Sclerosing adenosis of left breast was also pertinent to this visit.  HPI Victoria Hardin presents for persistent  sinus headache,  acc'd by productive cough .  headache has been present fot 2 weeks and is localized to the area around both  eyes. .  Sinus discharge is green.  No recent use of antibiotics.    Outpatient Medications Prior to Visit  Medication Sig Dispense Refill  . diphenhydrAMINE (BENADRYL) 25 MG tablet Take 25 mg by mouth every 6 (six) hours as needed.    Marland Kitchen. escitalopram (LEXAPRO) 10 MG tablet Take 1 tablet (10 mg total) by mouth daily. 90 tablet 1  . hydrochlorothiazide (HYDRODIURIL) 25 MG tablet Take 1 tablet (25 mg total) by mouth daily. 90 tablet 1  . ipratropium (ATROVENT) 0.03 % nasal spray Place 2 sprays into both nostrils every 12 (twelve) hours. 30 mL 12  . losartan (COZAAR) 100 MG tablet Take 1 tablet (100 mg total) by mouth daily. 90 tablet 1  . Multiple Vitamins-Minerals (MULTIVITAMIN WITH MINERALS) tablet Take 1 tablet by mouth daily.     No facility-administered medications prior to visit.     Review of Systems;  Patient denies  fevers, malaise, unintentional weight loss, skin rash, eye pain, sore throat, dysphagia,  Hemoptysis,  dyspnea, wheezing, chest pain, palpitations, orthopnea, edema, abdominal pain, nausea, melena, diarrhea, constipation, flank pain, dysuria, hematuria, urinary  Frequency, nocturia, numbness, tingling, seizures,  Focal weakness, Loss of consciousness,  Tremor, insomnia, depression, anxiety, and suicidal ideation.      Objective:  BP 120/80   Pulse 86   Temp 98.2 F (36.8 C) (Oral)   Wt 208 lb (94.3 kg)   SpO2 98%   BMI 36.85 kg/m   BP Readings from Last 3 Encounters:  03/27/18 120/80  11/13/17 104/76  08/11/17 110/72    Wt Readings from Last 3  Encounters:  03/27/18 208 lb (94.3 kg)  11/13/17 212 lb 3.2 oz (96.3 kg)  08/11/17 216 lb (98 kg)    General appearance: alert, cooperative and appears stated age Ears: normal TM's and external ear canals both ears Face: bilateral maxillary facial pain  Throat: lips, mucosa, and tongue normal; teeth and gums normal Neck: no adenopathy, no carotid bruit, supple, symmetrical, trachea midline and thyroid not enlarged, symmetric, no tenderness/mass/nodules Back: symmetric, no curvature. ROM normal. No CVA tenderness. Lungs: clear to auscultation bilaterally Heart: regular rate and rhythm, S1, S2 normal, no murmur, click, rub or gallop Abdomen: soft, non-tender; bowel sounds normal; no masses,  no organomegaly Pulses: 2+ and symmetric Skin: Skin color, texture, turgor normal. No rashes or lesions Lymph nodes: Cervical, supraclavicular, and axillary nodes normal.  Lab Results  Component Value Date   HGBA1C 6.2 09/11/2017   HGBA1C 6.3 02/16/2017   HGBA1C 5.9 11/14/2016    Lab Results  Component Value Date   CREATININE 0.92 09/11/2017   CREATININE 0.79 02/16/2017   CREATININE 0.74 11/14/2016    Lab Results  Component Value Date   WBC 6.7 09/11/2017   HGB 12.0 09/11/2017   HCT 36.8 09/11/2017   PLT 211.0 09/11/2017   GLUCOSE 93 09/11/2017   CHOL 190 09/11/2017   TRIG 74.0 09/11/2017   HDL 47.60 09/11/2017   LDLDIRECT 119.0 11/14/2016   LDLCALC 127 (H) 09/11/2017   ALT 9 09/11/2017   AST 11  09/11/2017   NA 141 09/11/2017   K 3.7 09/11/2017   CL 105 09/11/2017   CREATININE 0.92 09/11/2017   BUN 14 09/11/2017   CO2 28 09/11/2017   TSH 0.99 06/06/2016   HGBA1C 6.2 09/11/2017   MICROALBUR <0.7 11/14/2016     Assessment & Plan:   Problem List Items Addressed This Visit    Sclerosing adenosis of breast    She has been referred to [facility] for surgical excision       Recurrent sinusitis - Primary    Empiric antibiotics and prednisone described.  Navage sinus  irrigant prescribed.       Relevant Medications   diphenhydrAMINE (BENADRYL) 25 MG tablet   amoxicillin-clavulanate (AUGMENTIN) 875-125 MG tablet   predniSONE (DELTASONE) 10 MG tablet   Other Relevant Orders   DME Other see comment      I am having Tanazia Mandelbaum start on amoxicillin-clavulanate and predniSONE. I am also having her maintain her multivitamin with minerals, ipratropium, escitalopram, losartan, hydrochlorothiazide, and diphenhydrAMINE.  Meds ordered this encounter  Medications  . amoxicillin-clavulanate (AUGMENTIN) 875-125 MG tablet    Sig: Take 1 tablet by mouth 2 (two) times daily.    Dispense:  14 tablet    Refill:  0  . predniSONE (DELTASONE) 10 MG tablet    Sig: 6 tablets on Day 1 , then reduce by 1 tablet daily until gone    Dispense:  21 tablet    Refill:  0    There are no discontinued medications.  Follow-up: No follow-ups on file.   Sherlene Shams, MD

## 2018-04-18 ENCOUNTER — Ambulatory Visit: Payer: BC Managed Care – PPO | Admitting: Family Medicine

## 2018-04-18 ENCOUNTER — Encounter: Payer: Self-pay | Admitting: Family Medicine

## 2018-04-18 VITALS — BP 118/84 | HR 101 | Temp 99.4°F | Resp 18 | Ht 63.0 in | Wt 209.0 lb

## 2018-04-18 DIAGNOSIS — H6503 Acute serous otitis media, bilateral: Secondary | ICD-10-CM | POA: Diagnosis not present

## 2018-04-18 MED ORDER — DOXYCYCLINE HYCLATE 100 MG PO TABS: 100.0000 mg | ORAL_TABLET | Freq: Two times a day (BID) | ORAL | 0 refills | Status: DC

## 2018-04-18 MED ORDER — PREDNISONE 10 MG PO TABS: ORAL_TABLET | ORAL | 0 refills | Status: DC

## 2018-04-18 NOTE — Progress Notes (Signed)
Subjective:    Patient ID: Victoria Hardin, female    DOB: 09-Apr-1965, 53 y.o.   MRN: [id number]  HPI   Patient presents to clinic with continued ear pain, sinus pain, congestion that has been present for a little over 3 weeks.  Patient was prescribed a course of Augmentin on 03/27/2018 and steroids.  Patient states this antibiotic and steroid course did help to somewhat improve ear pain, but the right ear never improved, and the left ear did improve some but continues to remain painful.  At home patient has been taking Sudafed, Benadryl, doing salt water gargles and also Afrin nasal spray.  Patient Active Problem List   Diagnosis Date Noted  . Recurrent sinusitis 03/27/2018  . Sclerosing adenosis of breast 03/27/2018  . Cough 02/18/2017  . Cervical cancer screening 06/07/2016  . Prediabetes 06/07/2016  . Hot flushes, perimenopausal 06/06/2016  . Vitamin D deficiency 03/01/2015  . Family history of colon cancer requiring screening colonoscopy 02/23/2015  . Lumbar radiculitis 02/23/2015  . Encounter for preventive health examination 03/18/2014  . Iron deficiency 03/07/2014  . Edema 07/02/2013  . Carpal tunnel syndrome of left wrist 03/25/2013  . Hyperlipidemia 02/04/2013  . Obesity (BMI 30-39.9) 07/08/2011  . Hypertension 07/08/2011  . History of shingles   . History of kidney stones    Social History   Tobacco Use  . Smoking status: Never Smoker  . Smokeless tobacco: Never Used  Substance Use Topics  . Alcohol use: Yes    Alcohol/week: 1.0 standard drinks    Types: 1 Glasses of wine per week   Review of Systems  Constitutional: Negative for chills, fatigue and fever.  HENT: +congestion, ear pain, sinus pain and sore throat/clearing throat.   Eyes: Negative.   Respiratory: Negative for cough, shortness of breath and wheezing.   Cardiovascular: Negative for chest pain, palpitations and leg swelling.  Gastrointestinal: Negative for abdominal pain, diarrhea, nausea and  vomiting.  Genitourinary: Negative for dysuria, frequency and urgency.  Musculoskeletal: Negative for arthralgias and myalgias.  Skin: Negative for color change, pallor and rash.  Neurological: Negative for syncope, light-headedness and headaches.  Psychiatric/Behavioral: The patient is not nervous/anxious.       Objective:   Physical Exam Vitals signs and nursing note reviewed.  Constitutional:      General: She is not in acute distress.    Appearance: She is not toxic-appearing.  HENT:     Head: Normocephalic and atraumatic.     Right Ear: Tenderness present. A middle ear effusion is present. Tympanic membrane is injected and erythematous.     Left Ear: Tenderness present. A middle ear effusion is present. Tympanic membrane is injected and erythematous.     Ears:     Comments: Right more red/tender than left.     Nose: Congestion and rhinorrhea present.     Mouth/Throat:     Mouth: Mucous membranes are moist.     Comments: +post nasal drip Eyes:     General: No scleral icterus.    Extraocular Movements: Extraocular movements intact.     Pupils: Pupils are equal, round, and reactive to light.  Neck:     Musculoskeletal: Neck supple. No neck rigidity.  Cardiovascular:     Rate and Rhythm: Normal rate and regular rhythm.  Pulmonary:     Effort: Pulmonary effort is normal.     Breath sounds: Normal breath sounds.  Lymphadenopathy:     Cervical: No cervical adenopathy.  Skin:    General: Skin  is warm and dry.     Coloration: Skin is not jaundiced or pale.  Neurological:     Mental Status: She is alert and oriented to person, place, and time.  Psychiatric:        Mood and Affect: Mood normal.        Behavior: Behavior normal.    Vitals:   04/18/18 1027  BP: 118/84  Pulse: (!) 101  Resp: 18  Temp: 99.4 F (37.4 C)  SpO2: 96%      Assessment & Plan:    Bilateral otitis media - patient's physical exam is consistent with a bilateral ear infection.  Due to recent  Augmentin course, we will choose different class of antibiotic.  Patient will take doxycycline twice daily for 10 days.  Advised to continue using the Afrin nasal spray, and doing saline nasal rinses to reduce congestion.  Patient will take another round of steroids.  Also advised to take either Claritin or Allegra daily to help reduce sinus congestion and overall antihistamine response which most likely is contributing to patient's ear and congestion symptoms.  Advised patient to keep regular follow-up with PCP as planned and return to clinic sooner if any issues arise.  Also discussed option of possibly seeing ENT if current symptoms persist.

## 2018-04-18 NOTE — Patient Instructions (Signed)
Use Afrin nasal spray to reduce congestion and also can use saline nasal spray/saline nasal rinses  Also take either claritin 10 mg or Allegra 180 mg daily to reduce congestion/allergy symptoms

## 2018-05-17 ENCOUNTER — Ambulatory Visit (INDEPENDENT_AMBULATORY_CARE_PROVIDER_SITE_OTHER): Payer: BC Managed Care – PPO | Admitting: Internal Medicine

## 2018-05-17 DIAGNOSIS — J320 Chronic maxillary sinusitis: Secondary | ICD-10-CM | POA: Insufficient documentation

## 2018-05-17 MED ORDER — CLINDAMYCIN HCL 300 MG PO CAPS: 300.0000 mg | ORAL_CAPSULE | Freq: Three times a day (TID) | ORAL | 0 refills | Status: DC

## 2018-05-17 MED ORDER — LEVOFLOXACIN 500 MG PO TABS: 500.0000 mg | ORAL_TABLET | Freq: Every day | ORAL | 0 refills | Status: DC

## 2018-05-17 NOTE — Progress Notes (Signed)
Virtual Visit via Video Note  I connected with@ on 05/17/18 at  8:30 AM EDT by a video enabled telemedicine application and verified that I am speaking with the correct person using two identifiers.  Location patient: home Location provider:work or home office Persons participating in the virtual visit: patient, provider  I discussed the limitations of evaluation and management by telemedicine and the availability of in person appointments. The patient expressed understanding and agreed to proceed.   HPI:  Recurrent sinusitis. Patient was first treated  With Augmentin on Feb 11 for sinusitis/otitis,  Symptoms improved but recurred several days after finishing abx,  ,  Was treated again on  March 4 for bilateral ear pain and exam consistent with otitis, with doxycycline, nasal irrigation, and steroids .  Symptoms improved ,  But several days after finishing the antbiotic , her right ear started hurting and sinus drainage became purulent again. She has a frontal and maxillary headache bilaterally     ROS: See pertinent positives and negatives per HPI.  Past Medical History:  Diagnosis Date  . History of kidney stones   . Shingles outbreak     Past Surgical History:  Procedure Laterality Date  . ABDOMINAL HYSTERECTOMY     total hysterectomy    Family History  Problem Relation Age of Onset  . Cancer Mother 28       colon CA  . Heart disease Maternal Grandmother        smoker.  . Cancer Maternal Grandfather        lung, tobacco abuse    SOCIAL HX: unchanged , non smoker  Past Medical History:  Diagnosis Date  . History of kidney stones   . Shingles outbreak      Current Outpatient Medications:  .  Chlorpheniramine-Phenylephrine (SUDAFED PE SINUS/ALLERGY PO), Take 1 tablet by mouth at bedtime., Disp: , Rfl:  .  diphenhydrAMINE (BENADRYL) 25 MG tablet, Take 25 mg by mouth every 6 (six) hours as needed., Disp: , Rfl:  .  escitalopram (LEXAPRO) 10 MG tablet, Take 1 tablet (10  mg total) by mouth daily., Disp: 90 tablet, Rfl: 1 .  hydrochlorothiazide (HYDRODIURIL) 25 MG tablet, Take 1 tablet (25 mg total) by mouth daily., Disp: 90 tablet, Rfl: 1 .  ipratropium (ATROVENT) 0.03 % nasal spray, Place 2 sprays into both nostrils every 12 (twelve) hours., Disp: 30 mL, Rfl: 12 .  loratadine (CLARITIN) 10 MG tablet, Take 10 mg by mouth daily., Disp: , Rfl:  .  losartan (COZAAR) 100 MG tablet, Take 1 tablet (100 mg total) by mouth daily., Disp: 90 tablet, Rfl: 1 .  Multiple Vitamins-Minerals (MULTIVITAMIN WITH MINERALS) tablet, Take 1 tablet by mouth daily., Disp: , Rfl:  .  clindamycin (CLEOCIN) 300 MG capsule, Take 1 capsule (300 mg total) by mouth 3 (three) times daily., Disp: 42 capsule, Rfl: 0 .  levofloxacin (LEVAQUIN) 500 MG tablet, Take 1 tablet (500 mg total) by mouth daily., Disp: 14 tablet, Rfl: 0  EXAM:  VITALS per patient if applicable:  GENERAL: alert, oriented, appears well and in no acute distress  HEENT: atraumatic, conjunttiva clear, no obvious abnormalities on inspection of external nose and ears  NECK: normal movements of the head and neck  LUNGS: on inspection no signs of respiratory distress, breathing rate appears normal, no obvious gross SOB, gasping or wheezing  CV: no obvious cyanosis  MS: moves all visible extremities without noticeable abnormality  PSYCH/NEURO: pleasant and cooperative, no obvious depression or anxiety, speech and  thought processing grossly intact  ASSESSMENT AND PLAN:  Discussed the following assessment and plan:  Chronic sinusitis of both maxillary sinuses     I discussed the assessment and treatment plan with the patient. The patient was provided an opportunity to ask questions and all were answered. The patient agreed with the plan and demonstrated an understanding of the instructions.   The patient was advised to call back or seek an in-person evaluation if the symptoms worsen or if the condition fails to improve  as anticipated.  I provided 15 minutes of non-face-to-face time during this encounter.   Sherlene Shams, MD

## 2018-05-17 NOTE — Patient Instructions (Signed)
I am treating you for chronic sinusitis/otitis  With a longer course of antibiotics,  Since the previous treatments did not work.  YOu will need to see ENT if this fails to resolve your symptoms because you may have a blocked sinus .     I am prescribing 2  antibiotics (levaquin  Once daily  and clindamycin 3 times daily  ) to take for 2 weeks.   I also advise continued use of the following OTC meds to help with your other symptoms:   Take claritin every 12 hours    Sudafed PE  10 to 30 mg every 8 hours for the congestion, you may substitute Afrin nasal spray for the nighttime dose of sudafed PE  If needed to prevent insomnia.  flush your sinuses twice daily USING ONLY   Distilled water or sterilized tap water using your Navage     Please take a probiotic ( Align, Floraque or Culturelle),  the generic version of one of these, or eat a daily serving of yogurt   For a minimum of 4 weeks to prevent a serious antibiotic associated diarrhea  Called clostridium dificile colitis

## 2018-05-17 NOTE — Addendum Note (Signed)
Addended by: Sherlene Shams on: 05/17/2018 08:58 AM   Modules accepted: Orders

## 2018-05-17 NOTE — Assessment & Plan Note (Addendum)
With right sided otitis.  She has been treated with augmentin,  Then doxycycline.  Suspect she has a maxillary ostia that is occluded .  Will treat with levaquin/clindamycin x 2 weeks  Continue saline irrigation, decongestant and antihistamine for concurrent allergic rhinitis  .  Refer to ENT if infection does not resolve with two week treatment ,  Probiotic advised

## 2018-06-28 ENCOUNTER — Other Ambulatory Visit: Payer: Self-pay

## 2018-06-28 ENCOUNTER — Ambulatory Visit (INDEPENDENT_AMBULATORY_CARE_PROVIDER_SITE_OTHER): Payer: BC Managed Care – PPO | Admitting: Family Medicine

## 2018-06-28 ENCOUNTER — Encounter: Payer: Self-pay | Admitting: Family Medicine

## 2018-06-28 DIAGNOSIS — B3731 Acute candidiasis of vulva and vagina: Secondary | ICD-10-CM

## 2018-06-28 DIAGNOSIS — B373 Candidiasis of vulva and vagina: Secondary | ICD-10-CM | POA: Diagnosis not present

## 2018-06-28 MED ORDER — FLUCONAZOLE 150 MG PO TABS: ORAL_TABLET | ORAL | 0 refills | Status: DC

## 2018-06-28 NOTE — Progress Notes (Signed)
Patient ID: Victoria Hardin, female   DOB: 02/27/1965, 53 y.o.   MRN: [id number]    Virtual Visit via video Note  This visit type was conducted due to national recommendations for restrictions regarding the COVID-19 pandemic (e.g. social distancing).  This format is felt to be most appropriate for this patient at this time.  All issues noted in this document were discussed and addressed.  No physical exam was performed (except for noted visual exam findings with Video Visits).   I connected with Victoria Hardin today at  8:40 AM EDT by a video enabled telemedicine application and verified that I am speaking with the correct person using two identifiers. Location patient: home Location provider: [facility] Persons participating in the virtual visit: patient, provider  I discussed the limitations, risks, security and privacy concerns of performing an evaluation and management service by video and the availability of in person appointments. I also discussed with the patient that there may be a patient responsible charge related to this service. The patient expressed understanding and agreed to proceed.  HPI:  Patient and I connected via video due to complaints of vaginal itching and discharge for the past 3 or 4 days.  Patient recently finished an antibiotic and suspect she has a yeast infection.  She did buy over-the-counter Monistat has been using for 2 days, but does not seem to be helping as much as she would hope.  Patient denies any concerns for STDs.    Denies any issues urinating or any abdominal pain.  Denies vomiting or diarrhea.  Denies cough, shortness of breath or wheezing.  Denies chest pain.  Denies fever or chills.  ROS: See pertinent positives and negatives per HPI.  Past Medical History:  Diagnosis Date  . History of kidney stones   . Shingles outbreak     Past Surgical History:  Procedure Laterality Date  . ABDOMINAL HYSTERECTOMY     total hysterectomy    Family  History  Problem Relation Age of Onset  . Cancer Mother 43       colon CA  . Heart disease Maternal Grandmother        smoker.  . Cancer Maternal Grandfather        lung, tobacco abuse   Social History   Tobacco Use  . Smoking status: Never Smoker  . Smokeless tobacco: Never Used  Substance Use Topics  . Alcohol use: Yes    Alcohol/week: 1.0 standard drinks    Types: 1 Glasses of wine per week    Current Outpatient Medications:  .  Chlorpheniramine-Phenylephrine (SUDAFED PE SINUS/ALLERGY PO), Take 1 tablet by mouth at bedtime., Disp: , Rfl:  .  clindamycin (CLEOCIN) 300 MG capsule, Take 1 capsule (300 mg total) by mouth 3 (three) times daily., Disp: 42 capsule, Rfl: 0 .  diphenhydrAMINE (BENADRYL) 25 MG tablet, Take 25 mg by mouth every 6 (six) hours as needed., Disp: , Rfl:  .  escitalopram (LEXAPRO) 10 MG tablet, Take 1 tablet (10 mg total) by mouth daily., Disp: 90 tablet, Rfl: 1 .  hydrochlorothiazide (HYDRODIURIL) 25 MG tablet, Take 1 tablet (25 mg total) by mouth daily., Disp: 90 tablet, Rfl: 1 .  ipratropium (ATROVENT) 0.03 % nasal spray, Place 2 sprays into both nostrils every 12 (twelve) hours., Disp: 30 mL, Rfl: 12 .  levofloxacin (LEVAQUIN) 500 MG tablet, Take 1 tablet (500 mg total) by mouth daily., Disp: 14 tablet, Rfl: 0 .  loratadine (CLARITIN) 10 MG tablet, Take 10 mg by  mouth daily., Disp: , Rfl:  .  losartan (COZAAR) 100 MG tablet, Take 1 tablet (100 mg total) by mouth daily., Disp: 90 tablet, Rfl: 1 .  Multiple Vitamins-Minerals (MULTIVITAMIN WITH MINERALS) tablet, Take 1 tablet by mouth daily., Disp: , Rfl:  .  fluconazole (DIFLUCAN) 150 MG tablet, Take 1st tablet on day 1, take 2nd tablet on day 4, Disp: 2 tablet, Rfl: 0  EXAM:  GENERAL: alert, oriented, appears well and in no acute distress  HEENT: atraumatic, conjunttiva clear, no obvious abnormalities on inspection of external nose and ears  NECK: normal movements of the head and neck  LUNGS: on  inspection no signs of respiratory distress, breathing rate appears normal, no obvious gross SOB, gasping or wheezing  CV: no obvious cyanosis  MS: moves all visible extremities without noticeable abnormality  PSYCH/NEURO: pleasant and cooperative, no obvious depression or anxiety, speech and thought processing grossly intact  ASSESSMENT AND PLAN:  Discussed the following assessment and plan:  Vaginal candida - Plan: fluconazole (DIFLUCAN) 150 MG tablet  Patient symptoms do sound consistent with a vaginal yeast infection that most likely was brought up on recent antibiotic course.  Advised to finish Monistat 7-day course that she has begun and we will also have her take oral Diflucan.  Advised that if symptoms persist even after the Monistat cream and oral Diflucan, to let us know and at that point we will have her come in for vaginal exam.  Also encouraged to keep up good fluid intake, wear cotton underwear and always wipe front to back after using restroom.  Discussed eating a daily yogurt and or taking probiotic while on antibiotic course in the future to help prevent development of yeast infection.   I discussed the assessment and treatment plan with the patient. The patient was provided an opportunity to ask questions and all were answered. The patient agreed with the plan and demonstrated an understanding of the instructions.   The patient was advised to call back or seek an in-person evaluation if the symptoms worsen or if the condition fails to improve as anticipated.  Tracey HarriesLauren M Chilton Sallade, FNP

## 2018-07-30 MED ORDER — LOSARTAN POTASSIUM 100 MG PO TABS: 100.0000 mg | ORAL_TABLET | Freq: Every day | ORAL | 1 refills | Status: DC

## 2018-07-30 MED ORDER — HYDROCHLOROTHIAZIDE 25 MG PO TABS: 25.0000 mg | ORAL_TABLET | Freq: Every day | ORAL | 1 refills | Status: DC

## 2018-09-05 DIAGNOSIS — J320 Chronic maxillary sinusitis: Secondary | ICD-10-CM

## 2018-09-06 NOTE — Addendum Note (Signed)
Addended by: Crecencio Mc on: 09/06/2018 01:25 PM   Modules accepted: Orders

## 2018-11-13 ENCOUNTER — Other Ambulatory Visit: Payer: Self-pay

## 2018-11-15 ENCOUNTER — Other Ambulatory Visit (HOSPITAL_COMMUNITY)
Admission: RE | Admit: 2018-11-15 | Discharge: 2018-11-15 | Disposition: A | Discharge status: Home / Self Care | Payer: BC Managed Care – PPO | Source: Ambulatory Visit | Attending: Internal Medicine | Admitting: Internal Medicine

## 2018-11-15 ENCOUNTER — Ambulatory Visit (INDEPENDENT_AMBULATORY_CARE_PROVIDER_SITE_OTHER): Payer: BC Managed Care – PPO | Admitting: Internal Medicine

## 2018-11-15 ENCOUNTER — Encounter: Payer: Self-pay | Admitting: Internal Medicine

## 2018-11-15 ENCOUNTER — Other Ambulatory Visit: Payer: Self-pay

## 2018-11-15 VITALS — BP 118/76 | HR 78 | Temp 96.2°F | Resp 15 | Ht 63.0 in | Wt 214.0 lb

## 2018-11-15 DIAGNOSIS — R7303 Prediabetes: Secondary | ICD-10-CM

## 2018-11-15 DIAGNOSIS — N6022 Fibroadenosis of left breast: Secondary | ICD-10-CM | POA: Diagnosis not present

## 2018-11-15 DIAGNOSIS — Z124 Encounter for screening for malignant neoplasm of cervix: Secondary | ICD-10-CM | POA: Insufficient documentation

## 2018-11-15 DIAGNOSIS — N6489 Other specified disorders of breast: Secondary | ICD-10-CM

## 2018-11-15 DIAGNOSIS — I10 Essential (primary) hypertension: Secondary | ICD-10-CM | POA: Diagnosis not present

## 2018-11-15 DIAGNOSIS — Z0000 Encounter for general adult medical examination without abnormal findings: Secondary | ICD-10-CM | POA: Diagnosis not present

## 2018-11-15 DIAGNOSIS — R05 Cough: Secondary | ICD-10-CM

## 2018-11-15 DIAGNOSIS — R059 Cough, unspecified: Secondary | ICD-10-CM

## 2018-11-15 DIAGNOSIS — E669 Obesity, unspecified: Secondary | ICD-10-CM

## 2018-11-15 DIAGNOSIS — J310 Chronic rhinitis: Secondary | ICD-10-CM

## 2018-11-15 DIAGNOSIS — T485X5A Adverse effect of other anti-common-cold drugs, initial encounter: Secondary | ICD-10-CM

## 2018-11-15 MED ORDER — PULMICORT FLEXHALER 90 MCG/ACT IN AEPB: 1.0000 | INHALATION_SPRAY | Freq: Two times a day (BID) | RESPIRATORY_TRACT | 0 refills | Status: DC

## 2018-11-15 MED ORDER — OMEPRAZOLE 20 MG PO CPDR: 20.0000 mg | DELAYED_RELEASE_CAPSULE | Freq: Every day | ORAL | 3 refills | Status: DC

## 2018-11-15 NOTE — Patient Instructions (Signed)
Trial of omeprazole and pulmicort to treat chronic cough    Use both for one month .  Follow up one month with me  Referral to Dr Job Founds about the breast biopsies you had last year   Health Maintenance for Postmenopausal Women Menopause is a normal process in which your ability to get pregnant comes to an end. This process happens slowly over many months or years, usually between the ages of 21 and 38. Menopause is complete when you have missed your menstrual periods for 12 months. It is important to talk with your health care provider about some of the most common conditions that affect women after menopause (postmenopausal women). These include heart disease, cancer, and bone loss (osteoporosis). Adopting a healthy lifestyle and getting preventive care can help to promote your health and wellness. The actions you take can also lower your chances of developing some of these common conditions. What should I know about menopause? During menopause, you may get a number of symptoms, such as:  Hot flashes. These can be moderate or severe.  Night sweats.  Decrease in sex drive.  Mood swings.  Headaches.  Tiredness.  Irritability.  Memory problems.  Insomnia. Choosing to treat or not to treat these symptoms is a decision that you make with your health care provider. Do I need hormone replacement therapy?  Hormone replacement therapy is effective in treating symptoms that are caused by menopause, such as hot flashes and night sweats.  Hormone replacement carries certain risks, especially as you become older. If you are thinking about using estrogen or estrogen with progestin, discuss the benefits and risks with your health care provider. What is my risk for heart disease and stroke? The risk of heart disease, heart attack, and stroke increases as you age. One of the causes may be a change in the body's hormones during menopause. This can affect how your body uses dietary fats,  triglycerides, and cholesterol. Heart attack and stroke are medical emergencies. There are many things that you can do to help prevent heart disease and stroke. Watch your blood pressure  High blood pressure causes heart disease and increases the risk of stroke. This is more likely to develop in people who have high blood pressure readings, are of African descent, or are overweight.  Have your blood pressure checked: ? Every 3-5 years if you are 59-67 years of age. ? Every year if you are 47 years old or older. Eat a healthy diet   Eat a diet that includes plenty of vegetables, fruits, low-fat dairy products, and lean protein.  Do not eat a lot of foods that are high in solid fats, added sugars, or sodium. Get regular exercise Get regular exercise. This is one of the most important things you can do for your health. Most adults should:  Try to exercise for at least 150 minutes each week. The exercise should increase your heart rate and make you sweat (moderate-intensity exercise).  Try to do strengthening exercises at least twice each week. Do these in addition to the moderate-intensity exercise.  Spend less time sitting. Even light physical activity can be beneficial. Other tips  Work with your health care provider to achieve or maintain a healthy weight.  Do not use any products that contain nicotine or tobacco, such as cigarettes, e-cigarettes, and chewing tobacco. If you need help quitting, ask your health care provider.  Know your numbers. Ask your health care provider to check your cholesterol and your blood sugar (glucose).  Continue to have your blood tested as directed by your health care provider. Do I need screening for cancer? Depending on your health history and family history, you may need to have cancer screening at different stages of your life. This may include screening for:  Breast cancer.  Cervical cancer.  Lung cancer.  Colorectal cancer. What is my risk for  osteoporosis? After menopause, you may be at increased risk for osteoporosis. Osteoporosis is a condition in which bone destruction happens more quickly than new bone creation. To help prevent osteoporosis or the bone fractures that can happen because of osteoporosis, you may take the following actions:  If you are 26-27 years old, get at least 1,000 mg of calcium and at least 600 mg of vitamin D per day.  If you are older than age 6 but younger than age 77, get at least 1,200 mg of calcium and at least 600 mg of vitamin D per day.  If you are older than age 46, get at least 1,200 mg of calcium and at least 800 mg of vitamin D per day. Smoking and drinking excessive alcohol increase the risk of osteoporosis. Eat foods that are rich in calcium and vitamin D, and do weight-bearing exercises several times each week as directed by your health care provider. How does menopause affect my mental health? Depression may occur at any age, but it is more common as you become older. Common symptoms of depression include:  Low or sad mood.  Changes in sleep patterns.  Changes in appetite or eating patterns.  Feeling an overall lack of motivation or enjoyment of activities that you previously enjoyed.  Frequent crying spells. Talk with your health care provider if you think that you are experiencing depression. General instructions See your health care provider for regular wellness exams and vaccines. This may include:  Scheduling regular health, dental, and eye exams.  Getting and maintaining your vaccines. These include: ? Influenza vaccine. Get this vaccine each year before the flu season begins. ? Pneumonia vaccine. ? Shingles vaccine. ? Tetanus, diphtheria, and pertussis (Tdap) booster vaccine. Your health care provider may also recommend other immunizations. Tell your health care provider if you have ever been abused or do not feel safe at home. Summary  Menopause is a normal process in  which your ability to get pregnant comes to an end.  This condition causes hot flashes, night sweats, decreased interest in sex, mood swings, headaches, or lack of sleep.  Treatment for this condition may include hormone replacement therapy.  Take actions to keep yourself healthy, including exercising regularly, eating a healthy diet, watching your weight, and checking your blood pressure and blood sugar levels.  Get screened for cancer and depression. Make sure that you are up to date with all your vaccines. This information is not intended to replace advice given to you by your health care provider. Make sure you discuss any questions you have with your health care provider. Document Released: 03/25/2005 Document Revised: 01/24/2018 Document Reviewed: 01/24/2018 Elsevier Patient Education  2020 [employer].

## 2018-11-15 NOTE — Progress Notes (Signed)
Patient ID: [facility], female    DOB: [birth year]  Age: 53 y.o. MRN: [id number]  The patient is here for annual preventive  examination and management of other chronic and acute problems.  Last Mammogram Dec 2019 with biopsies;  All negative  Colonoscopy 2018    The risk factors are reflected in the social history.  The roster of all physicians providing medical care to patient - is listed in the Snapshot section of the chart.  Activities of daily living:  The patient is 100% independent in all ADLs: dressing, toileting, feeding as well as independent mobility  Home safety : The patient has smoke detectors in the home. They wear seatbelts.  There are no firearms at home. There is no violence in the home.   There is no risks for hepatitis, STDs or HIV. There is no   history of blood transfusion. They have no travel history to infectious disease endemic areas of the world.  The patient has seen their dentist in the last six month. They have seen their eye doctor in the last year. They admit to slight hearing difficulty with regard to whispered voices and some television programs.  They have deferred audiologic testing in the last year.  They do not  have excessive sun exposure. Discussed the need for sun protection: hats, long sleeves and use of sunscreen if there is significant sun exposure.   Diet: the importance of a healthy diet is discussed. They do have a healthy diet.  The benefits of regular aerobic exercise were discussed. She walks 4 times per week ,  20 minutes.   Depression screen: there are no signs or vegative symptoms of depression- irritability, change in appetite, anhedonia, sadness/tearfullness. \  The following portions of the patient's history were reviewed and updated as appropriate: allergies, current medications, past family history, past medical history,  past surgical history, past social history  and problem list.  Visual acuity was not assessed per patient  preference since she has regular follow up with her ophthalmologist. Hearing and body mass index were assessed and reviewed.   During the course of the visit the patient was educated and counseled about appropriate screening and preventive services including : fall prevention , diabetes screening, nutrition counseling, colorectal cancer screening, and recommended immunizations.    CC: The primary encounter diagnosis was Radial scar of breast. Diagnoses of Cervical cancer screening, Sclerosing adenosis of left breast, Essential hypertension, Prediabetes, Cough, Rhinitis medicamentosa, Encounter for preventive health examination, and Obesity (BMI 30-39.9) were also pertinent to this visit.  Chronic cough,  Seeing [facility]  Has has had No imporvement wants to try AN INHALER.  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  Radial scars.  Patient was referred to [facility] for excision but states that she was never called.  Has been lost to follow up ;  disucssed referral to [facility] for clarification of plan   Hypertension: patient checks blood pressure twice weekly at home.  Readings have been for the most part > 140/80 at rest . Patient is following a reduce salt diet most days and is taking medications as prescribed   History [facility] has a past medical history of History of kidney stones and Shingles outbreak.   She has a past surgical history that includes Abdominal hysterectomy.  Her family history includes Cancer in her maternal grandfather; Cancer (age of onset: 39) in her mother; Heart disease in her maternal grandmother.She reports that she has never smoked. She has never used smokeless tobacco. She reports current alcohol use of about 1.0 standard drinks of alcohol per week. No history on file for drug.  Outpatient  Medications Prior to Visit  Medication Sig Dispense Refill  . Chlorpheniramine-Phenylephrine (SUDAFED PE SINUS/ALLERGY PO) Take 1 tablet by mouth at bedtime.    . diphenhydrAMINE (BENADRYL) 25 MG tablet Take 25 mg by mouth every 6 (six) hours as needed.    . hydrochlorothiazide (HYDRODIURIL) 25 MG tablet Take 1 tablet (25 mg total) by mouth daily. 90 tablet 1  . ipratropium (ATROVENT) 0.03 % nasal spray Place 2 sprays into both nostrils every 12 (twelve) hours. 30 mL 12  . loratadine (CLARITIN) 10 MG tablet Take 10 mg by mouth daily.    Marland Kitchen losartan (COZAAR) 100 MG tablet Take 1 tablet (100 mg total) by mouth daily. 90 tablet 1  . Multiple Vitamins-Minerals (MULTIVITAMIN WITH MINERALS) tablet Take 1 tablet by mouth daily.    Marland Kitchen escitalopram (LEXAPRO) 10 MG tablet Take 1 tablet (10 mg total) by mouth daily. (Patient not taking: Reported on 11/15/2018) 90 tablet 1  . clindamycin (CLEOCIN) 300 MG capsule Take 1 capsule (300 mg total) by mouth 3 (three) times daily. (Patient not taking: Reported on 11/15/2018) 42 capsule 0  . fluconazole (DIFLUCAN) 150 MG tablet Take 1st tablet on day 1, take 2nd tablet on day 4 (Patient not taking: Reported on 11/15/2018) 2 tablet 0  . levofloxacin (LEVAQUIN) 500 MG tablet Take 1 tablet (500 mg total) by mouth daily. (Patient not taking: Reported on 11/15/2018) 14 tablet 0   No facility-administered medications prior to visit.     Review of Systems   Patient denies headache, fevers, malaise, unintentional weight loss, skin rash, eye pain, sinus congestion and sinus pain, sore throat, dysphagia,  hemoptysis , cough, dyspnea, wheezing, chest pain, palpitations, orthopnea, edema, abdominal pain, nausea, melena, diarrhea, constipation, flank pain, dysuria, hematuria, urinary  Frequency, nocturia, numbness, tingling, seizures,  Focal weakness, Loss of consciousness,  Tremor, insomnia, depression, anxiety, and suicidal ideation.      Objective:  BP 118/76 (BP Location: Left  Arm, Patient Position: Sitting, Cuff Size: Large)   Pulse 78   Temp (!) 96.2 F (35.7 C) (Temporal)   Resp 15   Ht 5\' 3"  (1.6 m)   Wt 214 lb (97.1 kg)   SpO2 97%   BMI 37.91 kg/m   Physical Exam   General Appearance:    Alert, cooperative, no distress, appears stated age  Head:    Normocephalic, without obvious abnormality, atraumatic  Eyes:    PERRL, conjunctiva/corneas clear, EOM's intact, fundi    benign, both eyes  Ears:    Normal TM's and external ear canals, both ears  Nose:   Nares normal, septum midline, mucosa normal, no drainage    or sinus tenderness  Throat:   Lips, mucosa, and tongue normal; teeth and gums normal  Neck:   Supple, symmetrical, trachea midline, no adenopathy;    thyroid:  no enlargement/tenderness/nodules; no carotid   bruit or JVD  Back:     Symmetric, no curvature, ROM normal, no CVA tenderness  Lungs:     Clear to auscultation bilaterally, respirations unlabored  Chest Wall:    No tenderness or deformity   Heart:    Regular rate and rhythm, S1 and S2  normal, no murmur, rub   or gallop  Breast Exam:    No tenderness, masses, or nipple abnormality  Abdomen:     Soft, non-tender, bowel sounds active all four quadrants,    no masses, no organomegaly  Genitalia:    Pelvic: cervix surgically absent, external genitalia normal, no adnexal masses or tenderness,  rectovaginal septum normal, uterus surgically  absent and vagina normal without discharge  Extremities:   Extremities normal, atraumatic, no cyanosis or edema  Pulses:   2+ and symmetric all extremities  Skin:   Skin color, texture, turgor normal, no rashes or lesions  Lymph nodes:   Cervical, supraclavicular, and axillary nodes normal  Neurologic:   CNII-XII intact, normal strength, sensation and reflexes    throughout    Assessment & Plan:   Problem List Items Addressed This Visit      Unprioritized   Obesity (BMI 30-39.9)    Her  random glucose is not  elevated but her A1c is in the  prediabetic range.  she remains at risk for developing diabetes she is following a  low glycemic index diet and particpating regularly in an aerobic  exercise activity.  We will check an A1c and fasting lipids every  6 months.   Lab Results  Component Value Date   HGBA1C 6.2 09/11/2017   Lab Results  Component Value Date   MICROALBUR <0.7 11/14/2016          Hypertension    Well controlled on current regimen. Renal function stable, no changes today.      Encounter for preventive health examination    age appropriate education and counseling updated, referrals for preventative services and immunizations addressed, dietary and smoking counseling addressed, most recent labs reviewed.  I have personally reviewed and have noted:  1) the patient's medical and social history 2) The pt's use of alcohol, tobacco, and illicit drugs 3) The patient's current medications and supplements 4) Functional ability including ADL's, fall risk, home safety risk, hearing and visual impairment 5) Diet and physical activities 6) Evidence for depression or mood disorder 7) The patient's height, weight, and BMI have been recorded in the chart  I have made referrals, and provided counseling and education based on review of the above      Cough    She has had no improvement. With current regimen.   Adding steroid nasal spray and PPI      Sclerosing adenosis of breast    She had been referred to [facility] for surgical excision one year ago but never received an appointment.  Referring locally to [facility] for managmenet .  Next mammogram is due in Nov .       Rhinitis medicamentosa    Due to chronic use of Afrin .  Stopped by ENT,  Using steroid nasal spray      Prediabetes   Relevant Orders   Comprehensive metabolic panel   Lipid panel   Hemoglobin A1c   Cervical cancer screening   Relevant Orders   Cytology - PAP( [place])    Other Visit Diagnoses    Radial scar of breast     -  Primary   Relevant Orders   Ambulatory referral to General Surgery      I have discontinued Amiliah House's levofloxacin, clindamycin, and fluconazole. I am also having her start on Pulmicort Flexhaler and omeprazole. Additionally, I am having her maintain her multivitamin with minerals, ipratropium, escitalopram, diphenhydrAMINE, Chlorpheniramine-Phenylephrine (SUDAFED PE SINUS/ALLERGY PO),  loratadine, losartan, and hydrochlorothiazide.  Meds ordered this encounter  Medications  . Budesonide (PULMICORT FLEXHALER) 90 MCG/ACT inhaler    Sig: Inhale 1 puff into the lungs 2 (two) times daily.    Dispense:  1 each    Refill:  0  . omeprazole (PRILOSEC) 20 MG capsule    Sig: Take 1 capsule (20 mg total) by mouth daily.    Dispense:  30 capsule    Refill:  3    Medications Discontinued During This Encounter  Medication Reason  . clindamycin (CLEOCIN) 300 MG capsule Completed Course  . fluconazole (DIFLUCAN) 150 MG tablet Completed Course  . levofloxacin (LEVAQUIN) 500 MG tablet Completed Course    Follow-up: Return in about 4 weeks (around 12/13/2018).   Sherlene Shamseresa L Abcde Oneil, MD

## 2018-11-17 ENCOUNTER — Encounter: Payer: Self-pay | Admitting: Internal Medicine

## 2018-11-17 DIAGNOSIS — J310 Chronic rhinitis: Secondary | ICD-10-CM | POA: Insufficient documentation

## 2018-11-17 NOTE — Assessment & Plan Note (Addendum)
She had been referred to [facility] for surgical excision one year ago but never received an appointment.  Referring locally to [facility] for [place] .  Next mammogram is due in Nov .

## 2018-11-17 NOTE — Assessment & Plan Note (Signed)
Her  random glucose is not  elevated but her A1c is in the prediabetic range.  she remains at risk for developing diabetes she is following a  low glycemic index diet and particpating regularly in an aerobic  exercise activity.  We will check an A1c and fasting lipids every  6 months.   Lab Results  Component Value Date   HGBA1C 6.2 09/11/2017   Lab Results  Component Value Date   MICROALBUR <0.7 11/14/2016

## 2018-11-17 NOTE — Assessment & Plan Note (Signed)

## 2018-11-17 NOTE — Assessment & Plan Note (Signed)
She has had no improvement. With current regimen.   Adding steroid nasal spray and PPI

## 2018-11-17 NOTE — Assessment & Plan Note (Signed)
Due to chronic use of Afrin .  Stopped by ENT,  Using steroid nasal spray

## 2018-11-17 NOTE — Assessment & Plan Note (Signed)
Well controlled on current regimen. Renal function stable, no changes today. 

## 2018-11-19 LAB — CYTOLOGY - PAP
Adequacy: ABSENT
Diagnosis: NEGATIVE
High risk HPV: NEGATIVE

## 2018-12-21 ENCOUNTER — Other Ambulatory Visit (INDEPENDENT_AMBULATORY_CARE_PROVIDER_SITE_OTHER): Payer: BC Managed Care – PPO

## 2018-12-21 ENCOUNTER — Other Ambulatory Visit: Payer: Self-pay

## 2018-12-21 DIAGNOSIS — R7303 Prediabetes: Secondary | ICD-10-CM | POA: Diagnosis not present

## 2018-12-21 LAB — LIPID PANEL
Cholesterol: 201 mg/dL — ABNORMAL HIGH (ref 0–200)
HDL: 49.2 mg/dL (ref >39.00)
LDL Cholesterol: 130 mg/dL — ABNORMAL HIGH (ref 0–99)
NonHDL: 151.35
Total CHOL/HDL Ratio: 4
Triglycerides: 107 mg/dL (ref 0.0–149.0)
VLDL: 21.4 mg/dL (ref 0.0–40.0)

## 2018-12-21 LAB — COMPREHENSIVE METABOLIC PANEL
ALT: 9 U/L (ref 0–35)
AST: 11 U/L (ref 0–37)
Albumin: 4 g/dL (ref 3.5–5.2)
Alkaline Phosphatase: 92 U/L (ref 39–117)
BUN: 15 mg/dL (ref 6–23)
CO2: 30 mEq/L (ref 19–32)
Calcium: 9.7 mg/dL (ref 8.4–10.5)
Chloride: 103 mEq/L (ref 96–112)
Creatinine, Ser: 0.79 mg/dL (ref 0.40–1.20)
GFR: 91.61 mL/min (ref >60.00)
Glucose, Bld: 84 mg/dL (ref 70–99)
Potassium: 3.6 mEq/L (ref 3.5–5.1)
Sodium: 139 mEq/L (ref 135–145)
Total Bilirubin: 0.4 mg/dL (ref 0.2–1.2)
Total Protein: 7.9 g/dL (ref 6.0–8.3)

## 2018-12-21 LAB — HEMOGLOBIN A1C: Hgb A1c MFr Bld: 6.2 % (ref 4.6–6.5)

## 2018-12-25 ENCOUNTER — Ambulatory Visit: Payer: BC Managed Care – PPO | Admitting: Internal Medicine

## 2018-12-25 ENCOUNTER — Other Ambulatory Visit: Payer: Self-pay

## 2018-12-25 ENCOUNTER — Encounter: Payer: Self-pay | Admitting: Internal Medicine

## 2018-12-25 DIAGNOSIS — E669 Obesity, unspecified: Secondary | ICD-10-CM

## 2018-12-25 DIAGNOSIS — R05 Cough: Secondary | ICD-10-CM

## 2018-12-25 DIAGNOSIS — R059 Cough, unspecified: Secondary | ICD-10-CM

## 2018-12-25 MED ORDER — PULMICORT FLEXHALER 90 MCG/ACT IN AEPB: 1.0000 | INHALATION_SPRAY | Freq: Two times a day (BID) | RESPIRATORY_TRACT | 2 refills | Status: DC

## 2018-12-25 NOTE — Assessment & Plan Note (Signed)
Her  random glucose is not  elevated but her A1c is in the prediabetic range.  she remains at risk for developing diabetes she is following a  low glycemic index diet and particpating regularly in an aerobic  exercise activity.   Lab Results  Component Value Date   HGBA1C 6.2 12/21/2018   Lab Results  Component Value Date   MICROALBUR <0.7 11/14/2016

## 2018-12-25 NOTE — Assessment & Plan Note (Signed)
Improved wit budesonide and PPI.  Pulmonary consult to rule out asthma  .  If negative,  GI eval for EE

## 2018-12-25 NOTE — Patient Instructions (Addendum)
I have refilled the inhaler.  I am making a referral to Dr Jayme Cloud to evaluate your for asthma  If there is no diagnosis of asthma made,  I will have you see GI to rule out eosinophilic esophagitis as the cause of your cough   I want you to lose 16 lbs over the next 4 months. Using exercise and the mediterranean /low glycemic index diet    Mediterranean Diet A Mediterranean diet refers to food and lifestyle choices that are based on the traditions of countries located on the [employer]. This way of eating has been shown to help prevent certain conditions and improve outcomes for people who have chronic diseases, like kidney disease and heart disease. What are tips for following this plan? Lifestyle  Cook and eat meals together with your family, when possible.  Drink enough fluid to keep your urine clear or pale yellow.  Be physically active every day. This includes: ? Aerobic exercise like running or swimming. ? Leisure activities like gardening, walking, or housework.  Get 7-8 hours of sleep each night.  If recommended by your health care provider, drink red wine in moderation. This means 1 glass a day for nonpregnant women and 2 glasses a day for men. A glass of wine equals 5 oz (150 mL). Reading food labels   Check the serving size of packaged foods. For foods such as rice and pasta, the serving size refers to the amount of cooked product, not dry.  Check the total fat in packaged foods. Avoid foods that have saturated fat or trans fats.  Check the ingredients list for added sugars, such as corn syrup. Shopping  At the grocery store, buy most of your food from the areas near the walls of the store. This includes: ? Fresh fruits and vegetables (produce). ? Grains, beans, nuts, and seeds. Some of these may be available in unpackaged forms or large amounts (in bulk). ? Fresh seafood. ? Poultry and eggs. ? Low-fat dairy products.  Buy whole ingredients instead of  prepackaged foods.  Buy fresh fruits and vegetables in-season from local farmers markets.  Buy frozen fruits and vegetables in resealable bags.  If you do not have access to quality fresh seafood, buy precooked frozen shrimp or canned fish, such as tuna, salmon, or sardines.  Buy small amounts of raw or cooked vegetables, salads, or olives from the deli or salad bar at your store.  Stock your pantry so you always have certain foods on hand, such as olive oil, canned tuna, canned tomatoes, rice, pasta, and beans. Cooking  Cook foods with extra-virgin olive oil instead of using butter or other vegetable oils.  Have meat as a side dish, and have vegetables or grains as your main dish. This means having meat in small portions or adding small amounts of meat to foods like pasta or stew.  Use beans or vegetables instead of meat in common dishes like chili or lasagna.  Experiment with different cooking methods. Try roasting or broiling vegetables instead of steaming or sauteing them.  Add frozen vegetables to soups, stews, pasta, or rice.  Add nuts or seeds for added healthy fat at each meal. You can add these to yogurt, salads, or vegetable dishes.  Marinate fish or vegetables using olive oil, lemon juice, garlic, and fresh herbs. Meal planning   Plan to eat 1 vegetarian meal one day each week. Try to work up to 2 vegetarian meals, if possible.  Eat seafood 2 or more times  a week.  Have healthy snacks readily available, such as: ? Vegetable sticks with hummus. ? [place] yogurt. ? Fruit and nut trail mix.  Eat balanced meals throughout the week. This includes: ? Fruit: 2-3 servings a day ? Vegetables: 4-5 servings a day ? Low-fat dairy: 2 servings a day ? Fish, poultry, or lean meat: 1 serving a day ? Beans and legumes: 2 or more servings a week ? Nuts and seeds: 1-2 servings a day ? Whole grains: 6-8 servings a day ? Extra-virgin olive oil: 3-4 servings a day  Limit red meat  and sweets to only a few servings a month What are my food choices?  Mediterranean diet ? Recommended  Grains: Whole-grain pasta. Brown rice. Bulgar wheat. Polenta. Couscous. Whole-wheat bread. [place].  Vegetables: Artichokes. Beets. Broccoli. Cabbage. Carrots. Eggplant. Green beans. Chard. Kale. Spinach. Onions. Leeks. Peas. Squash. Tomatoes. Peppers. Radishes.  Fruits: Apples. Apricots. Avocado. Berries. Bananas. Cherries. Dates. Figs. Grapes. Lemons. Melon. Oranges. Peaches. Plums. Pomegranate.  Meats and other protein foods: Beans. Almonds. Sunflower seeds. Pine nuts. Peanuts. [place]. Salmon. Scallops. Shrimp. [place]. Tilapia. Clams. Oysters. Eggs.  Dairy: Low-fat milk. Cheese. Greek yogurt.  Beverages: Water. Red wine. Herbal tea.  Fats and oils: Extra virgin olive oil. Avocado oil. Grape seed oil.  Sweets and desserts: [place] yogurt with honey. Baked apples. Poached pears. Trail mix.  Seasoning and other foods: Basil. Cilantro. Coriander. Cumin. Mint. Parsley. Sage. Rosemary. Tarragon. Garlic. Oregano. Thyme. Pepper. Balsalmic vinegar. Tahini. Hummus. Tomato sauce. Olives. Mushrooms. ? Limit these  Grains: Prepackaged pasta or rice dishes. Prepackaged cereal with added sugar.  Vegetables: Deep fried potatoes (french fries).  Fruits: Fruit canned in syrup.  Meats and other protein foods: Beef. Pork. Lamb. Poultry with skin. Hot dogs. Berniece Salines.  Dairy: Ice cream. Sour cream. Whole milk.  Beverages: Juice. Sugar-sweetened soft drinks. Beer. Liquor and spirits.  Fats and oils: Butter. Canola oil. Vegetable oil. Beef fat (tallow). Lard.  Sweets and desserts: Cookies. Cakes. Pies. Candy.  Seasoning and other foods: Mayonnaise. Premade sauces and marinades. The items listed may not be a complete list. Talk with your dietitian about what dietary choices are right for you. Summary  The Mediterranean diet includes both food and lifestyle choices.  Eat a variety of fresh  fruits and vegetables, beans, nuts, seeds, and whole grains.  Limit the amount of red meat and sweets that you eat.  Talk with your health care provider about whether it is safe for you to drink red wine in moderation. This means 1 glass a day for nonpregnant women and 2 glasses a day for men. A glass of wine equals 5 oz (150 mL). This information is not intended to replace advice given to you by your health care provider. Make sure you discuss any questions you have with your health care provider. Document Released: 09/24/2015 Document Revised: 10/01/2015 Document Reviewed: 09/24/2015 Elsevier Patient Education  2020 [employer].

## 2018-12-25 NOTE — Progress Notes (Signed)
Subjective:  Patient ID: Victoria Hardin, female    DOB: 1966-01-07  Age: 53 y.o. MRN: [id number]  CC: Diagnoses of Obesity (BMI 30-39.9) and Cough were pertinent to this visit.  HPI Victoria Hardin presents for  Follow up  On obesity , chronic cough and prediabetes  Cough has improved with use of inhALER (BUDESONIDE), and  oMEPRAZOLE .  Stopped the atrovent because it was not helping her rhinitis. See sees ENT agin Dec 2   Has not seem pulmonary of GI yet to rule out asthma and EE  Obesity:  Not exercising,  Weight gain noted.  Diet reviewed in detail.   Outpatient Medications Prior to Visit  Medication Sig Dispense Refill  . Chlorpheniramine-Phenylephrine (SUDAFED PE SINUS/ALLERGY PO) Take 1 tablet by mouth at bedtime.    . diphenhydrAMINE (BENADRYL) 25 MG tablet Take 25 mg by mouth every 6 (six) hours as needed.    Marland Kitchen escitalopram (LEXAPRO) 10 MG tablet Take 1 tablet (10 mg total) by mouth daily. 90 tablet 1  . hydrochlorothiazide (HYDRODIURIL) 25 MG tablet Take 1 tablet (25 mg total) by mouth daily. 90 tablet 1  . ipratropium (ATROVENT) 0.03 % nasal spray Place 2 sprays into both nostrils every 12 (twelve) hours. 30 mL 12  . loratadine (CLARITIN) 10 MG tablet Take 10 mg by mouth daily.    Marland Kitchen losartan (COZAAR) 100 MG tablet Take 1 tablet (100 mg total) by mouth daily. 90 tablet 1  . Multiple Vitamins-Minerals (MULTIVITAMIN WITH MINERALS) tablet Take 1 tablet by mouth daily.    Marland Kitchen omeprazole (PRILOSEC) 20 MG capsule Take 1 capsule (20 mg total) by mouth daily. 30 capsule 3  . Budesonide (PULMICORT FLEXHALER) 90 MCG/ACT inhaler Inhale 1 puff into the lungs 2 (two) times daily. 1 each 0   No facility-administered medications prior to visit.     Review of Systems;  Patient denies headache, fevers, malaise, unintentional weight loss, skin rash, eye pain, sinus congestion and sinus pain, sore throat, dysphagia,  hemoptysis , cough, dyspnea, wheezing, chest pain, palpitations, orthopnea,  edema, abdominal pain, nausea, melena, diarrhea, constipation, flank pain, dysuria, hematuria, urinary  Frequency, nocturia, numbness, tingling, seizures,  Focal weakness, Loss of consciousness,  Tremor, insomnia, depression, anxiety, and suicidal ideation.      Objective:  BP 128/88 (BP Location: Left Arm, Patient Position: Sitting, Cuff Size: Large)   Pulse 63   Temp (!) 96.8 F (36 C) (Temporal)   Ht 5\' 3"  (1.6 m)   Wt 215 lb 3.2 oz (97.6 kg)   SpO2 98%   BMI 38.12 kg/m   BP Readings from Last 3 Encounters:  12/25/18 128/88  11/15/18 118/76  04/18/18 118/84    Wt Readings from Last 3 Encounters:  12/25/18 215 lb 3.2 oz (97.6 kg)  11/15/18 214 lb (97.1 kg)  04/18/18 209 lb (94.8 kg)    General appearance: alert, cooperative and appears stated age Ears: normal TM's and external ear canals both ears Throat: lips, mucosa, and tongue normal; teeth and gums normal Neck: no adenopathy, no carotid bruit, supple, symmetrical, trachea midline and thyroid not enlarged, symmetric, no tenderness/mass/nodules Back: symmetric, no curvature. ROM normal. No CVA tenderness. Lungs: clear to auscultation bilaterally Heart: regular rate and rhythm, S1, S2 normal, no murmur, click, rub or gallop Abdomen: soft, non-tender; bowel sounds normal; no masses,  no organomegaly Pulses: 2+ and symmetric Skin: Skin color, texture, turgor normal. No rashes or lesions Lymph nodes: Cervical, supraclavicular, and axillary nodes normal.  Lab Results  Component Value Date   HGBA1C 6.2 12/21/2018   HGBA1C 6.2 09/11/2017   HGBA1C 6.3 02/16/2017    Lab Results  Component Value Date   CREATININE 0.79 12/21/2018   CREATININE 0.92 09/11/2017   CREATININE 0.79 02/16/2017    Lab Results  Component Value Date   WBC 6.7 09/11/2017   HGB 12.0 09/11/2017   HCT 36.8 09/11/2017   PLT 211.0 09/11/2017   GLUCOSE 84 12/21/2018   CHOL 201 (H) 12/21/2018   TRIG 107.0 12/21/2018   HDL 49.20 12/21/2018    LDLDIRECT 119.0 11/14/2016   LDLCALC 130 (H) 12/21/2018   ALT 9 12/21/2018   AST 11 12/21/2018   NA 139 12/21/2018   K 3.6 12/21/2018   CL 103 12/21/2018   CREATININE 0.79 12/21/2018   BUN 15 12/21/2018   CO2 30 12/21/2018   TSH 0.99 06/06/2016   HGBA1C 6.2 12/21/2018   MICROALBUR <0.7 11/14/2016    No results found.  Assessment & Plan:   Problem List Items Addressed This Visit      Unprioritized   Obesity (BMI 30-39.9)    Her  random glucose is not  elevated but her A1c is in the prediabetic range.  she remains at risk for developing diabetes she is following a  low glycemic index diet and particpating regularly in an aerobic  exercise activity.   Lab Results  Component Value Date   HGBA1C 6.2 12/21/2018   Lab Results  Component Value Date   MICROALBUR <0.7 11/14/2016          Cough    Improved wit budesonide and PPI.  Pulmonary consult to rule out asthma  .  If negative,  GI eval for EE       Relevant Orders   Ambulatory referral to Pulmonology      I am having Victoria Hardin maintain her multivitamin with minerals, ipratropium, escitalopram, diphenhydrAMINE, Chlorpheniramine-Phenylephrine (SUDAFED PE SINUS/ALLERGY PO), loratadine, losartan, hydrochlorothiazide, omeprazole, and Pulmicort Flexhaler.  Meds ordered this encounter  Medications  . Budesonide (PULMICORT FLEXHALER) 90 MCG/ACT inhaler    Sig: Inhale 1 puff into the lungs 2 (two) times daily.    Dispense:  1 each    Refill:  2    Medications Discontinued During This Encounter  Medication Reason  . Budesonide (PULMICORT FLEXHALER) 90 MCG/ACT inhaler Reorder    Follow-up: Return in about 4 weeks (around 01/22/2019).   Crecencio Mc, MD

## 2019-01-03 IMAGING — MR MR LUMBAR SPINE W/O CM
4 of 5 series · 26 of 48 positions shown · non-contrast
Comparison: Radiographs dated 11/22/2016

CLINICAL DATA: Low back pain and bilateral leg pain. Radiculopathy.

EXAM:
MRI LUMBAR SPINE WITHOUT CONTRAST
TECHNIQUE: Multiplanar, multisequence MR imaging of the lumbar spine was
performed. No intravenous contrast was administered.

[Series 2: T2 · sagittal · 4.0mm · 0.81mm/px · 6 of 15 slices shown (1 of 2)]
[im 1/15]
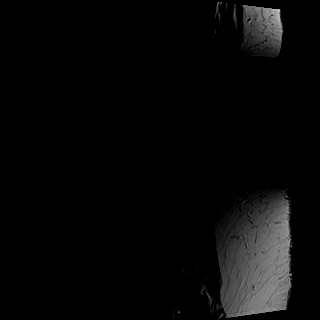
[im 3/15]
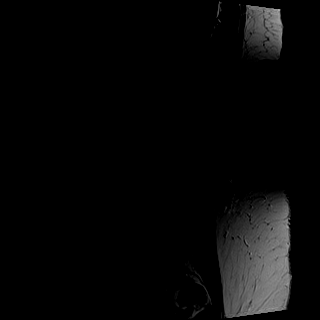
[im 6/15]
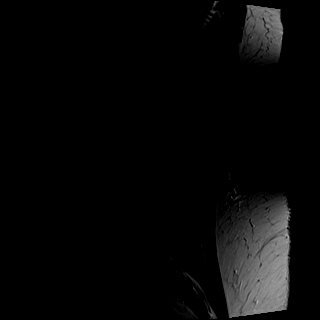
[im 9/15]
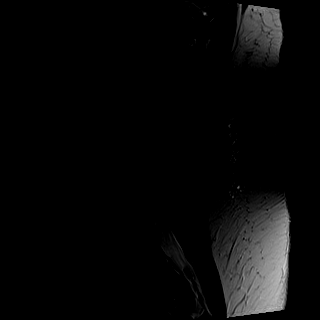
[im 12/15]
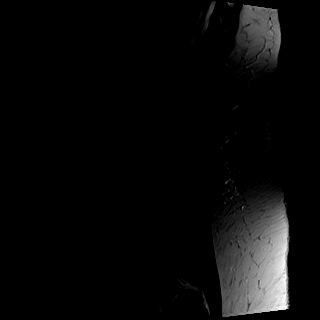
[im 15/15]
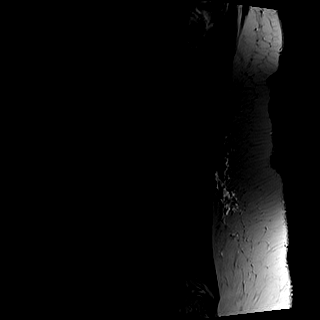

[Series 3: T1 · sagittal · 4.0mm · 0.41mm/px · 6 of 15 slices shown (1 of 2)]
[im 1/15]
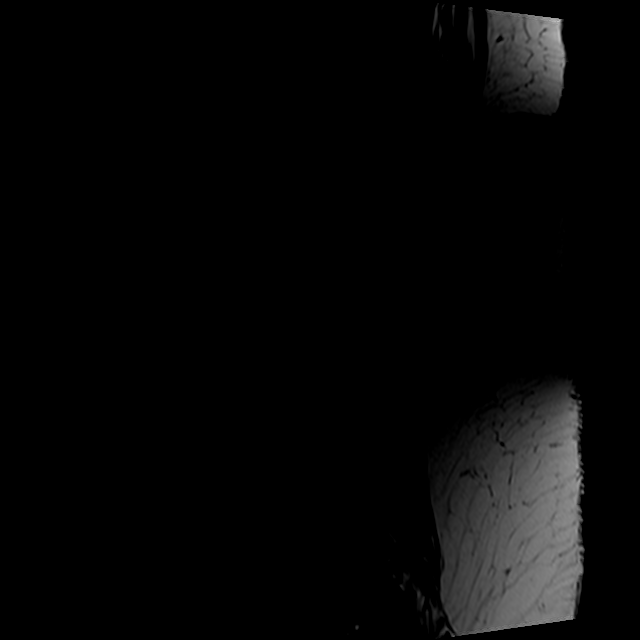
[im 3/15]
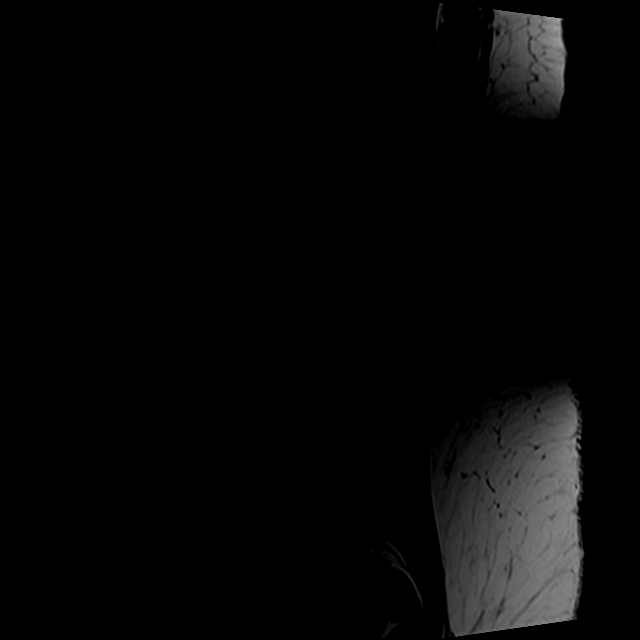
[im 6/15]
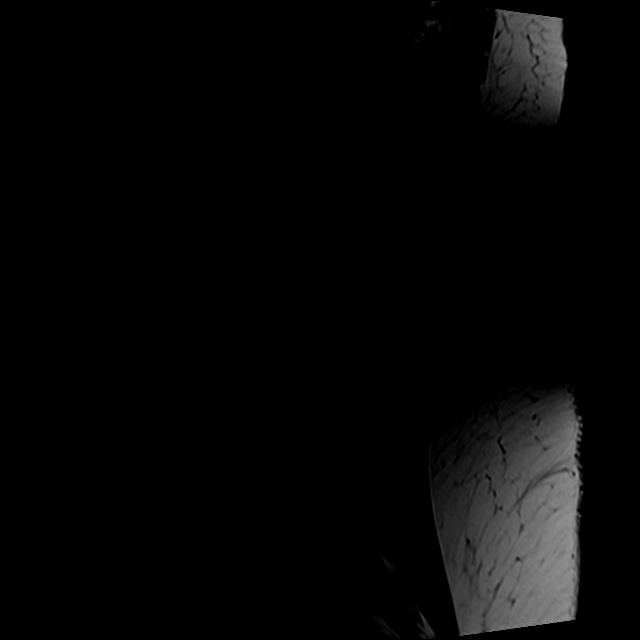
[im 9/15]
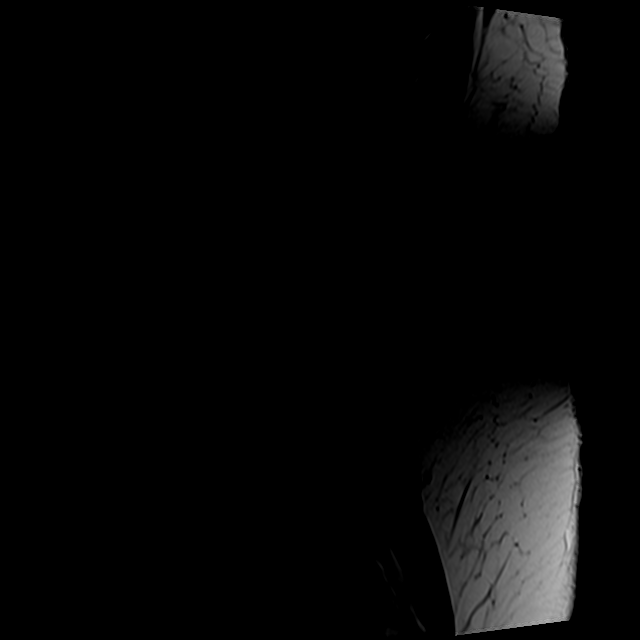
[im 12/15]
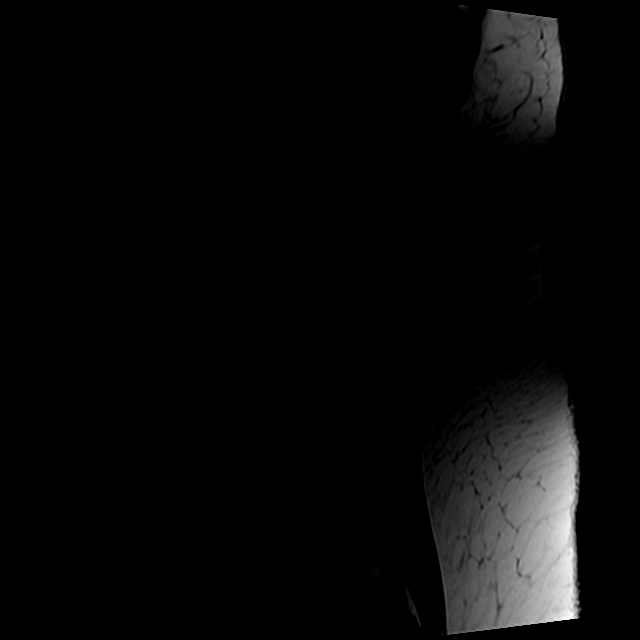
[im 15/15]
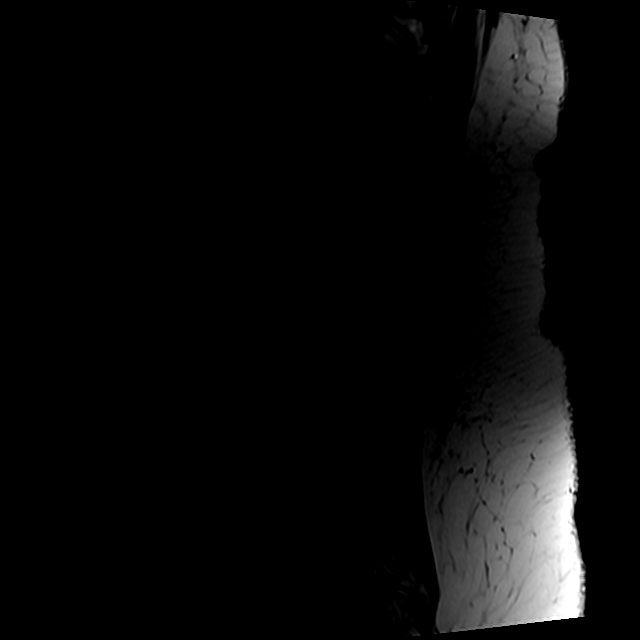

[Series 5: T2 · axial · 4.0mm · 0.78mm/px · z∈[-56,+160]mm · 9 of 37 slices shown (2 of 2)]
[im 1/37]
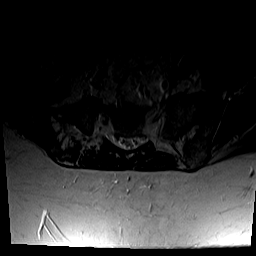
[im 6/37]
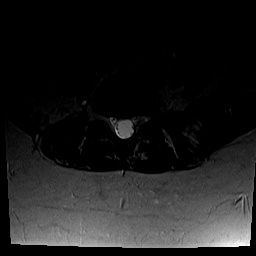
[im 11/37]
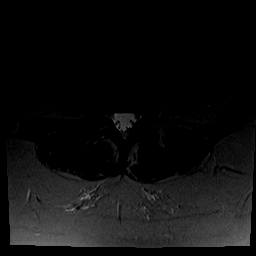
[im 16/37]
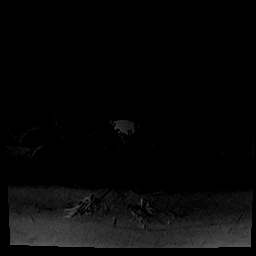
[im 19/37]
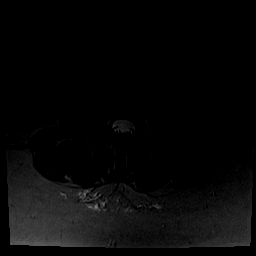
[im 21/37]
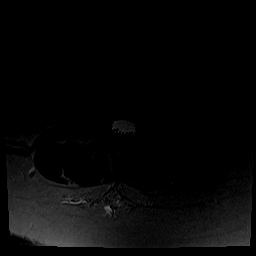
[im 26/37]
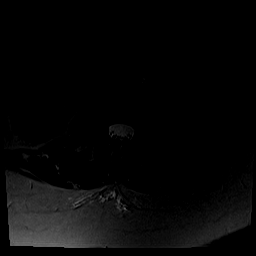
[im 31/37]
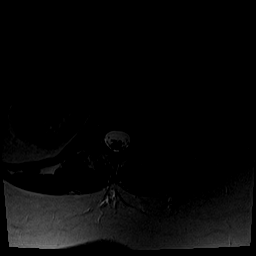
[im 37/37]
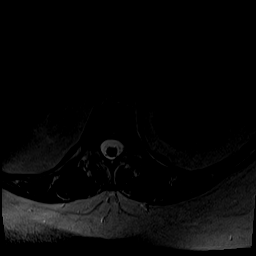

[Series 6: T1 · axial · 4.0mm · 0.39mm/px · z∈[-56,+131]mm · 5 of 37 slices shown (2 of 2)]
[im 1/37]
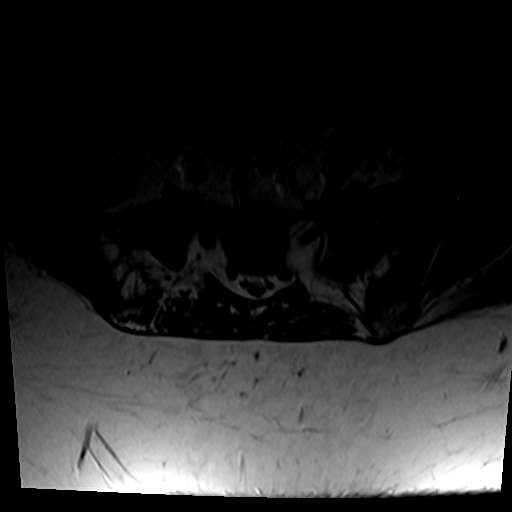
[im 6/37]
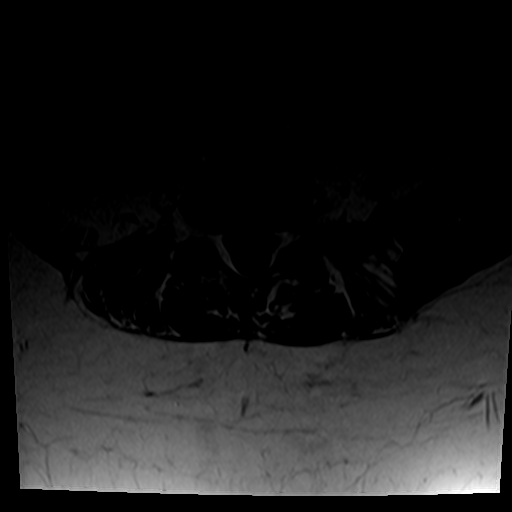
[im 11/37]
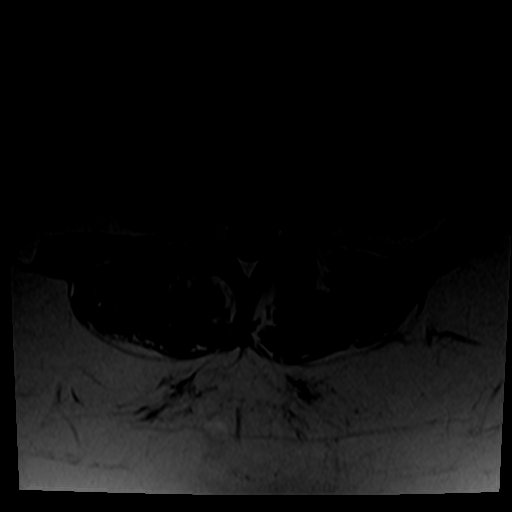
[im 19/37]
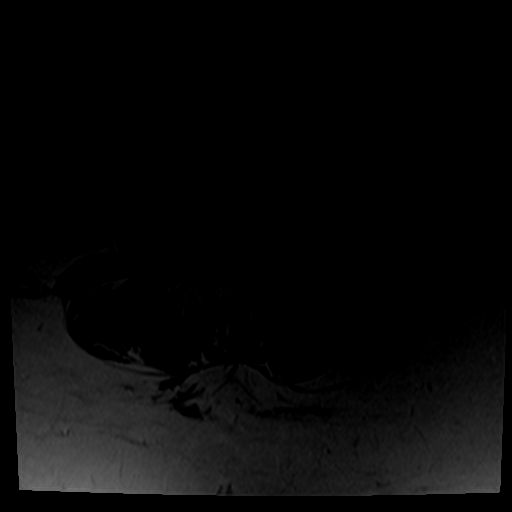
[im 31/37]
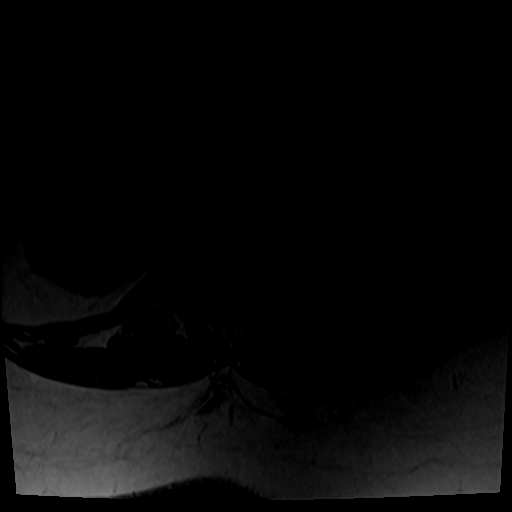

[26 of 48 positions shown; findings below may reference images not displayed]

FINDINGS: Segmentation:  Standard.

Alignment:  Mild lumbar scoliosis with convexity to the left.

Vertebrae:  No fracture, evidence of discitis, or bone lesion.

Conus medullaris: Extends to the L2 level and appears normal.

Paraspinal and other soft tissues: Negative.

Disc levels:

T12-L1 through L3-4: Normal discs. Minimal facet arthritis at L3-4
on the right.

L4-5: Tiny right foraminal disc bulge with no neural impingement.
Minimal right facet arthritis.

L5-S1: Tiny broad-based disc bulge. No neural impingement. Slight
degenerative changes of the facet joints.
IMPRESSION: 1. Minimal degenerative disc disease at L4-5 and L5-S1 without
neural impingement.
2. Minimal facet arthritis on the right at L3-4 and L4-5 and
bilaterally at L5-S1.

## 2019-01-30 ENCOUNTER — Other Ambulatory Visit: Payer: Self-pay | Admitting: Internal Medicine

## 2019-02-19 ENCOUNTER — Other Ambulatory Visit: Payer: Self-pay | Admitting: General Surgery

## 2019-02-19 DIAGNOSIS — N6489 Other specified disorders of breast: Secondary | ICD-10-CM

## 2019-02-26 ENCOUNTER — Encounter: Payer: Self-pay | Admitting: Pulmonary Disease

## 2019-02-26 ENCOUNTER — Ambulatory Visit: Payer: BC Managed Care – PPO | Admitting: Pulmonary Disease

## 2019-02-26 ENCOUNTER — Other Ambulatory Visit: Payer: Self-pay

## 2019-02-26 VITALS — BP 128/72 | HR 83 | Temp 97.6°F | Ht 63.0 in | Wt 216.2 lb

## 2019-02-26 DIAGNOSIS — D7218 Eosinophilia in diseases classified elsewhere: Secondary | ICD-10-CM

## 2019-02-26 DIAGNOSIS — E6609 Other obesity due to excess calories: Secondary | ICD-10-CM

## 2019-02-26 DIAGNOSIS — E669 Obesity, unspecified: Secondary | ICD-10-CM

## 2019-02-26 DIAGNOSIS — J683 Other acute and subacute respiratory conditions due to chemicals, gases, fumes and vapors: Secondary | ICD-10-CM

## 2019-02-26 DIAGNOSIS — J40 Bronchitis, not specified as acute or chronic: Secondary | ICD-10-CM

## 2019-02-26 DIAGNOSIS — I10 Essential (primary) hypertension: Secondary | ICD-10-CM

## 2019-02-26 DIAGNOSIS — Z6838 Body mass index (BMI) 38.0-38.9, adult: Secondary | ICD-10-CM

## 2019-02-26 NOTE — Patient Instructions (Addendum)
1.  It is very likely that you do have asthma or at the very minimum reactive airways disease which means that your airways are sensitive allergens and reflux.  Other possibilities include eosinophilic bronchitis which is a fancy way of saying that it is a bronchitis due to allergy.  We will need a breathing test in the future but currently these are being withheld due to Covid.  You may benefit from allergy evaluation.  2.  Continue Pulmicort for now.  Make sure you rinse her mouth well after use.  3.  Restart Claritin if your postnasal drip continues you may switch Claritin to Zyrtec 1 at bedtime.  4.  We will see you in follow-up in 6 months time call sooner should any new problems arise.

## 2019-02-26 NOTE — Progress Notes (Signed)
    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 Eosinophilic bronchitis  3. Class 2 obesity due to excess calories without serious comorbidity with body mass index (BMI) of 38.0 to 38.9 in adult  4. Essential hypertension  5. Obesity (BMI 30-39.9)   Patient Instructions  1.  It is very likely that you do have asthma or at the very minimum reactive airways disease which means that your airways are sensitive allergens and reflux.  Other possibilities include eosinophilic bronchitis which is a fancy way of saying that it is a bronchitis due to allergy.  We will need a breathing test in the future but currently these are being withheld due to Covid.  You may benefit from allergy evaluation.  2.  Continue Pulmicort  for now.  Make sure you rinse her mouth well after use.  3.  Restart Claritin if your postnasal drip continues you may switch Claritin to Zyrtec 1 at bedtime.  4.  We will see you in follow-up in 6 months time call sooner should any new problems arise.  Please note: late entry documentation due to logistical difficulties during COVID-19 pandemic. This note is filed for information purposes only, and is not intended to be used for billing, nor does it represent the full scope/nature of the visit in question. Please see any associated scanned media linked to date of encounter for additional pertinent information.  Subjective:    HPI: Victoria Hardin is a 54 y.o. female presenting to the pulmonology clinic on 02/26/2019 with report of: Pulmonary Consult (Referred by Dr. Tulo for cough to rule out asthma. Has since improved with inhaler. Pt denies any wheezing. fever, or chills.)     Outpatient Encounter Medications as of 02/26/2019  Medication Sig   diphenhydrAMINE (BENADRYL) 25 MG tablet Take 25 mg by mouth every 6 (six) hours as needed.   loratadine (CLARITIN) 10 MG tablet Take 10 mg by mouth daily.   Multiple Vitamins-Minerals  (MULTIVITAMIN WITH MINERALS) tablet Take 1 tablet by mouth daily.   [DISCONTINUED] Budesonide  (PULMICORT  FLEXHALER) 90 MCG/ACT inhaler Inhale 1 puff into the lungs 2 (two) times daily.   [DISCONTINUED] hydrochlorothiazide  (HYDRODIURIL ) 25 MG tablet Take 1 tablet by mouth once daily   [DISCONTINUED] losartan  (COZAAR ) 100 MG tablet Take 1 tablet by mouth once daily   [DISCONTINUED] omeprazole  (PRILOSEC) 20 MG capsule Take 1 capsule (20 mg total) by mouth daily. (Patient not taking: Reported on 05/31/2021)   [DISCONTINUED] Chlorpheniramine-Phenylephrine (SUDAFED PE SINUS/ALLERGY PO) Take 1 tablet by mouth at bedtime.   [DISCONTINUED] escitalopram  (LEXAPRO ) 10 MG tablet Take 1 tablet (10 mg total) by mouth daily. (Patient not taking: Reported on 02/26/2019)   [DISCONTINUED] ipratropium (ATROVENT ) 0.03 % nasal spray Place 2 sprays into both nostrils every 12 (twelve) hours.   No facility-administered encounter medications on file as of 02/26/2019.      Objective:   Vitals:   02/26/19 1517  BP: 128/72  Pulse: 83  Temp: 97.6 F (36.4 C)  Height: 5' 3 (1.6 m)  Weight: 216 lb 3.2 oz (98.1 kg)  SpO2: 100% Comment: on ra  TempSrc: Temporal  BMI (Calculated): 38.31     Physical exam documentation is limited by delayed entry of information.

## 2019-03-08 ENCOUNTER — Other Ambulatory Visit: Payer: BC Managed Care – PPO

## 2019-03-22 ENCOUNTER — Ambulatory Visit: Payer: BC Managed Care – PPO

## 2019-03-22 ENCOUNTER — Other Ambulatory Visit: Payer: Self-pay | Admitting: Internal Medicine

## 2019-03-22 ENCOUNTER — Other Ambulatory Visit: Payer: Self-pay

## 2019-03-22 ENCOUNTER — Ambulatory Visit
Admission: RE | Admit: 2019-03-22 | Discharge: 2019-03-22 | Disposition: A | Discharge status: Home / Self Care | Payer: BC Managed Care – PPO | Source: Ambulatory Visit | Attending: General Surgery | Admitting: General Surgery

## 2019-03-22 DIAGNOSIS — N6029 Fibroadenosis of unspecified breast: Secondary | ICD-10-CM

## 2019-03-22 DIAGNOSIS — N6489 Other specified disorders of breast: Secondary | ICD-10-CM

## 2019-03-25 ENCOUNTER — Telehealth: Payer: Self-pay | Admitting: Internal Medicine

## 2019-03-25 NOTE — Telephone Encounter (Signed)
Please let patient know that the Referral to [facility] for breast biopsies is in place,  But Dr Lemar Livings will be joining [facility] clinic April 1 and the insurance issues will no longer be an issue,  So if she prefers to stay in [place], will be able to provide her the same service if she wants to wait until April .  Her choice

## 2019-04-10 NOTE — Telephone Encounter (Signed)
Good morning!  I spoke with pt and informed her of the msg. Pt understood and was ok with it.

## 2019-04-10 NOTE — Telephone Encounter (Signed)
Can you  clarify:  Does she want to go to [facility] or wait for Dr Lemar Livings? (do I need to do anything?)

## 2019-04-12 NOTE — Telephone Encounter (Signed)
Good morning!  Pt stated she'll wait for Dr Shara Blazing.

## 2019-04-13 ENCOUNTER — Ambulatory Visit: Payer: BC Managed Care – PPO | Attending: Internal Medicine

## 2019-04-13 DIAGNOSIS — Z23 Encounter for immunization: Secondary | ICD-10-CM | POA: Insufficient documentation

## 2019-04-13 NOTE — Progress Notes (Signed)
   Covid-19 Vaccination Clinic  Name:  Victoria Hardin    MRN: [id number] DOB: May 01, 1965  04/13/2019  [name] was observed post Covid-19 immunization for 15 minutes without incidence. She was provided with Vaccine Information Sheet and instruction to access the V-Safe system.   [name] was instructed to call 911 with any severe reactions post vaccine: Marland Kitchen Difficulty breathing  . Swelling of your face and throat  . A fast heartbeat  . A bad rash all over your body  . Dizziness and weakness    Immunizations Administered    Name Date Dose VIS Date Route   Moderna COVID-19 Vaccine 04/13/2019  3:36 PM 0.5 mL 01/15/2019 Intramuscular   Manufacturer: Moderna   Lot: [id number]   NDC: 30159-968-95

## 2019-05-01 ENCOUNTER — Encounter: Payer: Self-pay | Admitting: Internal Medicine

## 2019-05-01 ENCOUNTER — Other Ambulatory Visit: Payer: Self-pay

## 2019-05-01 ENCOUNTER — Ambulatory Visit (INDEPENDENT_AMBULATORY_CARE_PROVIDER_SITE_OTHER): Payer: BC Managed Care – PPO | Admitting: Internal Medicine

## 2019-05-01 DIAGNOSIS — J683 Other acute and subacute respiratory conditions due to chemicals, gases, fumes and vapors: Secondary | ICD-10-CM | POA: Diagnosis not present

## 2019-05-01 DIAGNOSIS — E669 Obesity, unspecified: Secondary | ICD-10-CM | POA: Diagnosis not present

## 2019-05-01 DIAGNOSIS — N6022 Fibroadenosis of left breast: Secondary | ICD-10-CM

## 2019-05-01 MED ORDER — PHENTERMINE HCL 37.5 MG PO TABS: 37.5000 mg | ORAL_TABLET | Freq: Every day | ORAL | 2 refills | Status: DC

## 2019-05-01 NOTE — Progress Notes (Signed)
Subjective:  Patient ID: Victoria Hardin, female    DOB: Sep 22, 1965  Age: 54 y.o. MRN: [id number]  CC: Diagnoses of Reactive airways dysfunction syndrome (HCC), Sclerosing adenosis of left breast, and Obesity (BMI 30-39.9) were pertinent to this visit.  HPI Victoria Hardin presents for follow up on hypertension, prediabetes, and obesity.   This visit occurred during the SARS-CoV-2 public health emergency.  Safety protocols were in place, including screening questions prior to the visit, additional usage of staff PPE, and extensive cleaning of exam room while observing appropriate contact time as indicated for disinfecting solutions.   Patient has received the first dose of the Moderna  COVID 19 vaccine without complications.  Patient continues to mask when outside of the home except when walking in yard or at safe distances from others .  Patient denies any change in mood or development of unhealthy behaviors resuting from the pandemic's restriction of activities and socialization.     She feels generally well  , but has been unable to lose weight due to increased appetite.    Outpatient Medications Prior to Visit  Medication Sig Dispense Refill  . Budesonide (PULMICORT FLEXHALER) 90 MCG/ACT inhaler Inhale 1 puff into the lungs 2 (two) times daily. 1 each 2  . diphenhydrAMINE (BENADRYL) 25 MG tablet Take 25 mg by mouth every 6 (six) hours as needed.    . hydrochlorothiazide (HYDRODIURIL) 25 MG tablet Take 1 tablet by mouth once daily 90 tablet 1  . loratadine (CLARITIN) 10 MG tablet Take 10 mg by mouth daily.    Marland Kitchen losartan (COZAAR) 100 MG tablet Take 1 tablet by mouth once daily 90 tablet 1  . Multiple Vitamins-Minerals (MULTIVITAMIN WITH MINERALS) tablet Take 1 tablet by mouth daily.    Marland Kitchen omeprazole (PRILOSEC) 20 MG capsule Take 1 capsule (20 mg total) by mouth daily. 30 capsule 3  . ipratropium (ATROVENT) 0.03 % nasal spray Place 2 sprays into both nostrils every 12 (twelve) hours. 30 mL 12     No facility-administered medications prior to visit.    Review of Systems;  Patient denies headache, fevers, malaise, unintentional weight loss, skin rash, eye pain, sinus congestion and sinus pain, sore throat, dysphagia,  hemoptysis , cough, dyspnea, wheezing, chest pain, palpitations, orthopnea, edema, abdominal pain, nausea, melena, diarrhea, constipation, flank pain, dysuria, hematuria, urinary  Frequency, nocturia, numbness, tingling, seizures,  Focal weakness, Loss of consciousness,  Tremor, insomnia, depression, anxiety, and suicidal ideation.      Objective:  BP 128/78 (BP Location: Left Arm, Patient Position: Sitting, Cuff Size: Large)   Pulse 73   Temp (!) 96.8 F (36 C) (Temporal)   Resp 15   Ht 5\' 3"  (1.6 m)   Wt 220 lb (99.8 kg)   SpO2 99%   BMI 38.97 kg/m   BP Readings from Last 3 Encounters:  05/01/19 128/78  02/26/19 128/72  12/25/18 128/88    Wt Readings from Last 3 Encounters:  05/01/19 220 lb (99.8 kg)  02/26/19 216 lb 3.2 oz (98.1 kg)  12/25/18 215 lb 3.2 oz (97.6 kg)    General appearance: alert, cooperative and appears stated age Ears: normal TM's and external ear canals both ears Throat: lips, mucosa, and tongue normal; teeth and gums normal Neck: no adenopathy, no carotid bruit, supple, symmetrical, trachea midline and thyroid not enlarged, symmetric, no tenderness/mass/nodules Back: symmetric, no curvature. ROM normal. No CVA tenderness. Lungs: clear to auscultation bilaterally Heart: regular rate and rhythm, S1, S2 normal, no murmur, click, rub or  gallop Abdomen: soft, non-tender; bowel sounds normal; no masses,  no organomegaly Pulses: 2+ and symmetric Skin: Skin color, texture, turgor normal. No rashes or lesions Lymph nodes: Cervical, supraclavicular, and axillary nodes normal.  Lab Results  Component Value Date   HGBA1C 6.2 12/21/2018   HGBA1C 6.2 09/11/2017   HGBA1C 6.3 02/16/2017    Lab Results  Component Value Date    CREATININE 0.79 12/21/2018   CREATININE 0.92 09/11/2017   CREATININE 0.79 02/16/2017    Lab Results  Component Value Date   WBC 6.7 09/11/2017   HGB 12.0 09/11/2017   HCT 36.8 09/11/2017   PLT 211.0 09/11/2017   GLUCOSE 84 12/21/2018   CHOL 201 (H) 12/21/2018   TRIG 107.0 12/21/2018   HDL 49.20 12/21/2018   LDLDIRECT 119.0 11/14/2016   LDLCALC 130 (H) 12/21/2018   ALT 9 12/21/2018   AST 11 12/21/2018   NA 139 12/21/2018   K 3.6 12/21/2018   CL 103 12/21/2018   CREATININE 0.79 12/21/2018   BUN 15 12/21/2018   CO2 30 12/21/2018   TSH 0.99 06/06/2016   HGBA1C 6.2 12/21/2018   MICROALBUR <0.7 11/14/2016    MM DIAG BREAST TOMO BILATERAL  Result Date: 03/22/2019 CLINICAL DATA:  54 year old female with history bilateral breast stereotactic biopsies in December of 2019. Pathology results indicated complex sclerosing lesions bilaterally. EXAM: DIGITAL DIAGNOSTIC BILATERAL MAMMOGRAM WITH CAD AND TOMO COMPARISON:  Previous exam(s). ACR Breast Density Category c: The breast tissue is heterogeneously dense, which may obscure small masses. FINDINGS: The distortion in the right breast which is about 1.5 cm lateral to the coil biopsy marking clip is stable in appearance. No significant interval change is seen at the site of biopsy at the coil shaped biopsy marking clip in the superior central left breast. No new suspicious calcifications, masses or areas of distortion are seen in the bilateral breasts. Mammographic images were processed with CAD. IMPRESSION: 1. No significant interval change in the appearance of the bilateral previously biopsied areas of distortion (complex sclerosing lesions). No new findings in the bilateral breasts. RECOMMENDATION: Surgical consultation is recommended for the bilateral complex sclerosing lesions. The patient states that she would like a referral to [facility], which will need to be arranged by her physician. I have sent a epic message to Dr. Darrick Huntsman on 03/22/19 at 305pm to  assist in arranging the referral. I have discussed the findings and recommendations with the patient. If applicable, a reminder letter will be sent to the patient regarding the next appointment. BI-RADS CATEGORY  4: Suspicious. Electronically Signed   By: Frederico Hamman M.D.   On: 03/22/2019 15:13    Assessment & Plan:   Problem List Items Addressed This Visit      Unprioritized   Obesity (BMI 30-39.9)    Her ability to exercise is limited by her chronic back pain and RAD.  Discussed diet,  Lifestyle, options for exercise.  Needs appetite suppression.  I have authorized the use of phentermine for 3 months  and a  return to see me in 3 months. Goal weight loss is 5% of today's weight,  Or 11 lba  by then or phentermine will be discontinued.        Relevant Medications   phentermine (ADIPEX-P) 37.5 MG tablet   Reactive airways dysfunction syndrome (HCC)    Suspected by pulmonology,  Awaiting end of COVID pandemic so PFTs can be done.  Continue Pulmicort, PPI and antihistamine       Sclerosing adenosis of  breast    She is scheduled to see Dr Bary Castilla next month for surgical excision        I provided  30 minutes of face-to-face time during this encounter reviewing patient's current problems and past surgeries, labs and imaging studies, providing counseling on the above mentioned problems , and coordination  of care .  I have discontinued Shaelin Abruzzese's ipratropium. I am also having her start on phentermine. Additionally, I am having her maintain her multivitamin with minerals, diphenhydrAMINE, loratadine, omeprazole, Pulmicort Flexhaler, losartan, and hydrochlorothiazide.  Meds ordered this encounter  Medications  . phentermine (ADIPEX-P) 37.5 MG tablet    Sig: Take 1 tablet (37.5 mg total) by mouth daily before breakfast.    Dispense:  30 tablet    Refill:  2    Medications Discontinued During This Encounter  Medication Reason  . ipratropium (ATROVENT) 0.03 % nasal spray  Patient has not taken in last 30 days    Follow-up: Return in about 3 months (around 08/01/2019).   Crecencio Mc, MD

## 2019-05-01 NOTE — Patient Instructions (Addendum)
I have authorized the use of phentermine for 3 months.  Please have your vital signs checked a week after starting, and return to see me in 3 months  Take 1/2 tablet in the morning,  The second half by 2 PM to avoid insomnia.  Or take the whole tablet in the  Afternoon (before 3 pm )  Goal weight loss is  5% of your starting weight by the end of the  3 months   For you ,  Based on today's reading,  11 lbs ,     You can make you own shake if it is 300 calories   Total goal is 1200 calories daily  Or less  If no loss in one week , reduce caloric goal to 1000 or add another day of exercise (30 min)   Phentermine tablets or capsules What is this medicine? PHENTERMINE (FEN ter meen) decreases your appetite. It is used with a reduced calorie diet and exercise to help you lose weight. This medicine may be used for other purposes; ask your health care provider or pharmacist if you have questions. COMMON BRAND NAME(S): Adipex-P, Atti-Plex P, Atti-Plex P Spansule, Fastin, Lomaira, Pro-Fast, Tara-8 What should I tell my health care provider before I take this medicine? They need to know if you have any of these conditions:  agitation or nervousness  diabetes  glaucoma  heart disease  high blood pressure  history of drug abuse or addiction  history of stroke  kidney disease  lung disease called Primary Pulmonary Hypertension (PPH)  taken an MAOI like Carbex, Eldepryl, Marplan, Nardil, or Parnate in last 14 days  taking stimulant medicines for attention disorders, weight loss, or to stay awake  thyroid disease  an unusual or allergic reaction to phentermine, other medicines, foods, dyes, or preservatives  pregnant or trying to get pregnant  breast-feeding How should I use this medicine? Take this medicine by mouth with a glass of water. Follow the directions on the prescription label. Take your medicine at regular intervals. Do not take it more often than directed. Do not stop  taking except on your doctor's advice. Talk to your pediatrician regarding the use of this medicine in children. While this drug may be prescribed for children 17 years or older for selected conditions, precautions do apply. Overdosage: If you think you have taken too much of this medicine contact a poison control center or emergency room at once. NOTE: This medicine is only for you. Do not share this medicine with others. What if I miss a dose? If you miss a dose, take it as soon as you can. If it is almost time for your next dose, take only that dose. Do not take double or extra doses. What may interact with this medicine? Do not take this medicine with any of the following medications:  MAOIs like Carbex, Eldepryl, Marplan, Nardil, and Parnate This medicine may also interact with the following medications:  alcohol  certain medicines for depression, anxiety, or psychotic disorders  certain medicines for high blood pressure  linezolid  medicines for colds or breathing difficulties like pseudoephedrine or phenylephrine  medicines for diabetes  sibutramine  stimulant medicines for attention disorders, weight loss, or to stay awake This list may not describe all possible interactions. Give your health care provider a list of all the medicines, herbs, non-prescription drugs, or dietary supplements you use. Also tell them if you smoke, drink alcohol, or use illegal drugs. Some items may interact with your medicine.  What should I watch for while using this medicine? Visit your doctor or health care provider for regular checks on your progress. Do not stop taking except on your health care provider's advice. You may develop a severe reaction. Your health care provider will tell you how much medicine to take. Do not take this medicine close to bedtime. It may prevent you from sleeping. You may get drowsy or dizzy. Do not drive, use machinery, or do anything that needs mental alertness until  you know how this medicine affects you. Do not stand or sit up quickly, especially if you are an older patient. This reduces the risk of dizzy or fainting spells. Alcohol may increase dizziness and drowsiness. Avoid alcoholic drinks. This medicine may affect blood sugar levels. Ask your healthcare provider if changes in diet or medicines are needed if you have diabetes. Women should inform their health care provider if they wish to become pregnant or think they might be pregnant. Losing weight while pregnant is not advised and may cause harm to the unborn child. Talk to your health care provider for more information. What side effects may I notice from receiving this medicine? Side effects that you should report to your doctor or health care professional as soon as possible:  allergic reactions like skin rash, itching or hives, swelling of the face, lips, or tongue  breathing problems  changes in emotions or moods  changes in vision  chest pain or chest tightness  fast, irregular heartbeat  feeling faint or lightheaded  increased blood pressure  irritable  restlessness  tremors  seizures  signs and symptoms of a stroke like changes in vision; confusion; trouble speaking or understanding; severe headaches; sudden numbness or weakness of the face, arm or leg; trouble walking; dizziness; loss of balance or coordination  unusually weak or tired Side effects that usually do not require medical attention (report to your doctor or health care professional if they continue or are bothersome):  changes in taste  constipation or diarrhea  dizziness  dry mouth  headache  trouble sleeping  upset stomach This list may not describe all possible side effects. Call your doctor for medical advice about side effects. You may report side effects to FDA at 1-800-FDA-1088. Where should I keep my medicine? Keep out of the reach of children. This medicine can be abused. Keep your medicine  in a safe place to protect it from theft. Do not share this medicine with anyone. Selling or giving away this medicine is dangerous and against the law. This medicine may cause harm and death if it is taken by other adults, children, or pets. Return medicine that has not been used to an official disposal site. Contact the DEA at 205-136-2305 or your city/county government to find a site. If you cannot return the medicine, mix any unused medicine with a substance like cat litter or coffee grounds. Then throw the medicine away in a sealed container like a sealed bag or coffee can with a lid. Do not use the medicine after the expiration date. Store at room temperature between 20 and 25 degrees C (68 and 77 degrees F). Keep container tightly closed. NOTE: This sheet is a summary. It may not cover all possible information. If you have questions about this medicine, talk to your doctor, pharmacist, or health care provider.  2020 Elsevier/Gold Standard (2018-12-07 12:54:20)

## 2019-05-03 NOTE — Assessment & Plan Note (Signed)
Her ability to exercise is limited by her chronic back pain and RAD.  Discussed diet,  Lifestyle, options for exercise.  Needs appetite suppression.  I have authorized the use of phentermine for 3 months  and a  return to see me in 3 months. Goal weight loss is 5% of today's weight,  Or 11 lba  by then or phentermine will be discontinued.   

## 2019-05-03 NOTE — Assessment & Plan Note (Addendum)
Suspected by pulmonology,  Awaiting end of COVID pandemic so PFTs can be done.  Continue Pulmicort, PPI and antihistamine  

## 2019-05-03 NOTE — Assessment & Plan Note (Signed)
Her ability to exercise is limited by her chronic back pain and RAD.  Discussed diet,  Lifestyle, options for exercise.  Needs appetite suppression.  I have authorized the use of phentermine for 3 months  and a  return to see me in 3 months. Goal weight loss is 5% of today's weight,  Or 11 lba  by then or phentermine will be discontinued.

## 2019-05-03 NOTE — Assessment & Plan Note (Signed)
She is scheduled to see Dr Lemar Livings next month for surgical excision

## 2019-05-03 NOTE — Assessment & Plan Note (Signed)
Well controlled on current regimen. Renal function stable, no changes today. Advised t o recheck BP after starting phentermine

## 2019-05-03 NOTE — Assessment & Plan Note (Signed)
Suspected by pulmonology,  Awaiting end of COVID pandemic so PFTs can be done.  Continue Pulmicort, PPI and antihistamine

## 2019-05-11 ENCOUNTER — Ambulatory Visit: Payer: BC Managed Care – PPO | Attending: Internal Medicine

## 2019-05-11 DIAGNOSIS — Z23 Encounter for immunization: Secondary | ICD-10-CM

## 2019-05-11 NOTE — Progress Notes (Signed)
   Covid-19 Vaccination Clinic  Name:  Peggye Poon    MRN: [id number] DOB: [birth year]  05/11/2019  Ms. Delahunt was observed post Covid-19 immunization for 15 minutes without incident. She was provided with Vaccine Information Sheet and instruction to access the V-Safe system.   Ms. Rudnicki was instructed to call 911 with any severe reactions post vaccine: Marland Kitchen Difficulty breathing  . Swelling of face and throat  . A fast heartbeat  . A bad rash all over body  . Dizziness and weakness   Immunizations Administered    Name Date Dose VIS Date Route   Moderna COVID-19 Vaccine 05/11/2019  2:03 PM 0.5 mL 01/15/2019 Intramuscular   Manufacturer: Gala Murdoch   Lot: [id number]   NDC: 12197-588-32

## 2019-05-28 ENCOUNTER — Telehealth: Payer: Self-pay | Admitting: Internal Medicine

## 2019-05-28 NOTE — Telephone Encounter (Signed)
Victoria Hardin,  I received a message that you have not returned phone calls from Dr Rutherford Nail office. They are  trying to set you up with [facility] for the surgery you need to remove these masses which can turn into cancer .  Please call them ASAP.    Regards,   Duncan Dull, MD

## 2019-05-28 NOTE — Telephone Encounter (Addendum)
Patient returned call and she was notified of PCP message , patient stated she has tried to call [facility] back but cannot get through to their office , patient stated the best time to contact her is between 3:30 and 4 PM advised I would notify [facility] of her best contact hours and patient staed she would continue to call them.  Called Dr. Lemar Livings Office at [phone] spoke with referral coordinator and was advised they would try to cal patient at the preferred time between 3:30 and 4 PM .

## 2019-05-28 NOTE — Telephone Encounter (Signed)
Can you call patent re (read my mychart message about ) her breast condition and need for surgery?Marland Kitchen

## 2019-05-28 NOTE — Telephone Encounter (Addendum)
Called and left message to call ASAP on cell number no voicemail available calling home number will continue to call. Also sent My chart message with copy of PCP message.

## 2019-07-08 DIAGNOSIS — R928 Other abnormal and inconclusive findings on diagnostic imaging of breast: Secondary | ICD-10-CM | POA: Insufficient documentation

## 2019-08-03 ENCOUNTER — Other Ambulatory Visit: Payer: Self-pay | Admitting: Internal Medicine

## 2019-08-21 ENCOUNTER — Encounter: Payer: Self-pay | Admitting: Internal Medicine

## 2019-08-21 ENCOUNTER — Ambulatory Visit (INDEPENDENT_AMBULATORY_CARE_PROVIDER_SITE_OTHER): Payer: BC Managed Care – PPO | Admitting: Internal Medicine

## 2019-08-21 ENCOUNTER — Other Ambulatory Visit: Payer: Self-pay

## 2019-08-21 DIAGNOSIS — N6029 Fibroadenosis of unspecified breast: Secondary | ICD-10-CM

## 2019-08-21 DIAGNOSIS — R7303 Prediabetes: Secondary | ICD-10-CM

## 2019-08-21 DIAGNOSIS — I10 Essential (primary) hypertension: Secondary | ICD-10-CM | POA: Diagnosis not present

## 2019-08-21 DIAGNOSIS — E669 Obesity, unspecified: Secondary | ICD-10-CM | POA: Diagnosis not present

## 2019-08-21 MED ORDER — PHENTERMINE HCL 37.5 MG PO TABS: 37.5000 mg | ORAL_TABLET | Freq: Every day | ORAL | 2 refills | Status: DC

## 2019-08-21 NOTE — Progress Notes (Signed)
Subjective:  Patient ID: Victoria Hardin, female    DOB: 1965-06-22  Age: 54 y.o. MRN: [id number]  CC: Diagnoses of Sclerosing adenosis of breast, unspecified laterality, Prediabetes, Obesity (BMI 30-39.9), and Essential hypertension were pertinent to this visit.  HPI Victoria Hardin presents for follow up on multiple conditions .  This visit occurred during the SARS-CoV-2 public health emergency.  Safety protocols were in place, including screening questions prior to the visit, additional usage of staff PPE, and extensive cleaning of exam room while observing appropriate contact time as indicated for disinfecting solutions.    Bilateral needle biopsies done  By [facility] in May for follow up on complex  Sclerosis adenosis .    Obesity:  She is motivated to lose weight but is having difficulty due to limited mobility and increased appetite  Diet and exercise options reviewed    Outpatient Medications Prior to Visit  Medication Sig Dispense Refill  . Budesonide (PULMICORT FLEXHALER) 90 MCG/ACT inhaler Inhale 1 puff into the lungs 2 (two) times daily. 1 each 2  . diphenhydrAMINE (BENADRYL) 25 MG tablet Take 25 mg by mouth every 6 (six) hours as needed.    . hydrochlorothiazide (HYDRODIURIL) 25 MG tablet Take 1 tablet by mouth once daily 90 tablet 0  . loratadine (CLARITIN) 10 MG tablet Take 10 mg by mouth daily.    Marland Kitchen losartan (COZAAR) 100 MG tablet Take 1 tablet by mouth once daily 90 tablet 0  . Multiple Vitamins-Minerals (MULTIVITAMIN WITH MINERALS) tablet Take 1 tablet by mouth daily.    Marland Kitchen omeprazole (PRILOSEC) 20 MG capsule Take 1 capsule (20 mg total) by mouth daily. 30 capsule 3  . phentermine (ADIPEX-P) 37.5 MG tablet Take 1 tablet (37.5 mg total) by mouth daily before breakfast. 30 tablet 2   No facility-administered medications prior to visit.    Review of Systems;  Patient denies headache, fevers, malaise, unintentional weight loss, skin rash, eye pain, sinus  congestion and sinus pain, sore throat, dysphagia,  hemoptysis , cough, dyspnea, wheezing, chest pain, palpitations, orthopnea, edema, abdominal pain, nausea, melena, diarrhea, constipation, flank pain, dysuria, hematuria, urinary  Frequency, nocturia, numbness, tingling, seizures,  Focal weakness, Loss of consciousness,  Tremor, insomnia, depression, anxiety, and suicidal ideation.      Objective:  BP 104/70 (BP Location: Left Arm, Patient Position: Sitting, Cuff Size: Large)   Pulse 78   Temp 98.1 F (36.7 C) (Temporal)   Resp 15   Ht 5\' 3"  (1.6 m)   Wt 215 lb 12.8 oz (97.9 kg)   SpO2 98%   BMI 38.23 kg/m   BP Readings from Last 3 Encounters:  08/21/19 104/70  05/01/19 128/78  02/26/19 128/72    Wt Readings from Last 3 Encounters:  08/21/19 215 lb 12.8 oz (97.9 kg)  05/01/19 220 lb (99.8 kg)  02/26/19 216 lb 3.2 oz (98.1 kg)    General appearance: alert, cooperative and appears stated age Ears: normal TM's and external ear canals both ears Throat: lips, mucosa, and tongue normal; teeth and gums normal Neck: no adenopathy, no carotid bruit, supple, symmetrical, trachea midline and thyroid not enlarged, symmetric, no tenderness/mass/nodules Back: symmetric, no curvature. ROM normal. No CVA tenderness. Lungs: clear to auscultation bilaterally Heart: regular rate and rhythm, S1, S2 normal, no murmur, click, rub or gallop Abdomen: soft, non-tender; bowel sounds normal; no masses,  no organomegaly Pulses: 2+ and symmetric Skin: Skin color, texture, turgor normal. No rashes or lesions Lymph nodes: Cervical, supraclavicular, and axillary nodes  normal.  Lab Results  Component Value Date   HGBA1C 6.2 12/21/2018   HGBA1C 6.2 09/11/2017   HGBA1C 6.3 02/16/2017    Lab Results  Component Value Date   CREATININE 0.79 12/21/2018   CREATININE 0.92 09/11/2017   CREATININE 0.79 02/16/2017    Lab Results  Component Value Date   WBC 6.7 09/11/2017   HGB 12.0 09/11/2017   HCT  36.8 09/11/2017   PLT 211.0 09/11/2017   GLUCOSE 84 12/21/2018   CHOL 201 (H) 12/21/2018   TRIG 107.0 12/21/2018   HDL 49.20 12/21/2018   LDLDIRECT 119.0 11/14/2016   LDLCALC 130 (H) 12/21/2018   ALT 9 12/21/2018   AST 11 12/21/2018   NA 139 12/21/2018   K 3.6 12/21/2018   CL 103 12/21/2018   CREATININE 0.79 12/21/2018   BUN 15 12/21/2018   CO2 30 12/21/2018   TSH 0.99 06/06/2016   HGBA1C 6.2 12/21/2018   MICROALBUR <0.7 11/14/2016    MM DIAG BREAST TOMO BILATERAL  Result Date: 03/22/2019 CLINICAL DATA:  54 year old female with history bilateral breast stereotactic biopsies in December of 2019. Pathology results indicated complex sclerosing lesions bilaterally. EXAM: DIGITAL DIAGNOSTIC BILATERAL MAMMOGRAM WITH CAD AND TOMO COMPARISON:  Previous exam(s). ACR Breast Density Category c: The breast tissue is heterogeneously dense, which may obscure small masses. FINDINGS: The distortion in the right breast which is about 1.5 cm lateral to the coil biopsy marking clip is stable in appearance. No significant interval change is seen at the site of biopsy at the coil shaped biopsy marking clip in the superior central left breast. No new suspicious calcifications, masses or areas of distortion are seen in the bilateral breasts. Mammographic images were processed with CAD. IMPRESSION: 1. No significant interval change in the appearance of the bilateral previously biopsied areas of distortion (complex sclerosing lesions). No new findings in the bilateral breasts. RECOMMENDATION: Surgical consultation is recommended for the bilateral complex sclerosing lesions. The patient states that she would like a referral to [facility], which will need to be arranged by her physician. I have sent a epic message to Dr. Darrick Huntsman on 03/22/19 at 305pm to assist in arranging the referral. I have discussed the findings and recommendations with the patient. If applicable, a reminder letter will be sent to the patient regarding the next  appointment. BI-RADS CATEGORY  4: Suspicious. Electronically Signed   By: Frederico Hamman M.D.   On: 03/22/2019 15:13    Assessment & Plan:   Problem List Items Addressed This Visit      Unprioritized   Obesity (BMI 30-39.9)    Her ability to exercise is limited by her chronic back pain and RAD.  Discussed diet,  Lifestyle, options for exercise.  Needs appetite suppression.  I have authorized the use of phentermine for 3 months  and a  return to see me in 3 months. Goal weight loss is 5% of today's weight,  Or 11 lbs  by then or phentermine will be discontinued.        Relevant Medications   phentermine (ADIPEX-P) 37.5 MG tablet   Hypertension    Well controlled on current regimen. Renal function stable, no changes today.      Prediabetes    Her  random glucose is not  elevated but her A1c is in the prediabetic range.  she remains at risk for developing diabetes she is following a  low glycemic index diet and particpating regularly in an aerobic  exercise activity.  We will check  an A1c and fasting lipids every  6 months.   Lab Results  Component Value Date   HGBA1C 6.2 12/21/2018   Lab Results  Component Value Date   MICROALBUR <0.7 11/14/2016          Sclerosing adenosis of breast    She underwent bilateral excisional biopsies at [facility] recently that were benign and has been released by surgical oncology to follow up with annual mammograms          I am having Victoria Hardin maintain her multivitamin with minerals, diphenhydrAMINE, loratadine, omeprazole, Pulmicort Flexhaler, hydrochlorothiazide, losartan, and phentermine.  Meds ordered this encounter  Medications  . phentermine (ADIPEX-P) 37.5 MG tablet    Sig: Take 1 tablet (37.5 mg total) by mouth daily before breakfast.    Dispense:  30 tablet    Refill:  2    Medications Discontinued During This Encounter  Medication Reason  . phentermine (ADIPEX-P) 37.5 MG tablet Reorder    Follow-up: Return in about 3  months (around 11/21/2019).   Sherlene Shams, MD

## 2019-08-21 NOTE — Patient Instructions (Signed)
I have authorized the use of phentermine for 3 months.  Goal weight loss is  5% of your starting weight by the end of the  3 months .   For you ,  Based on today's reading,  11 lbs    Fat and Cholesterol Restricted Eating Plan Getting too much fat and cholesterol in your diet may cause health problems. Choosing the right foods helps keep your fat and cholesterol at normal levels. This can keep you from getting certain diseases. Your doctor may recommend an eating plan that includes:  Total fat: ______% or less of total calories a day.  Saturated fat: ______% or less of total calories a day.  Cholesterol: less than _________mg a day.  Fiber: ______g a day. What are tips for following this plan? Meal planning  At meals, divide your plate into four equal parts: ? Fill one-half of your plate with vegetables and green salads. ? Fill one-fourth of your plate with whole grains. ? Fill one-fourth of your plate with low-fat (lean) protein foods.  Eat fish that is high in omega-3 fats at least two times a week. This includes mackerel, tuna, sardines, and salmon.  Eat foods that are high in fiber, such as whole grains, beans, apples, broccoli, carrots, peas, and barley. General tips   Work with your doctor to lose weight if you need to.  Avoid: ? Foods with added sugar. ? Fried foods. ? Foods with partially hydrogenated oils.  Limit alcohol intake to no more than 1 drink a day for nonpregnant women and 2 drinks a day for men. One drink equals 12 oz of beer, 5 oz of wine, or 1 oz of hard liquor. Reading food labels  Check food labels for: ? Trans fats. ? Partially hydrogenated oils. ? Saturated fat (g) in each serving. ? Cholesterol (mg) in each serving. ? Fiber (g) in each serving.  Choose foods with healthy fats, such as: ? Monounsaturated fats. ? Polyunsaturated fats. ? Omega-3 fats.  Choose grain products that have whole grains. Look for the word "whole" as the first word  in the ingredient list. Cooking  Cook foods using low-fat methods. These include baking, boiling, grilling, and broiling.  Eat more home-cooked foods. Eat at restaurants and buffets less often.  Avoid cooking using saturated fats, such as butter, cream, palm oil, palm kernel oil, and coconut oil. Recommended foods  Fruits  All fresh, canned (in natural juice), or frozen fruits. Vegetables  Fresh or frozen vegetables (raw, steamed, roasted, or grilled). Green salads. Grains  Whole grains, such as whole wheat or whole grain breads, crackers, cereals, and pasta. Unsweetened oatmeal, bulgur, barley, quinoa, or brown rice. Corn or whole wheat flour tortillas. Meats and other protein foods  Ground beef (85% or leaner), grass-fed beef, or beef trimmed of fat. Skinless chicken or [place]. Ground chicken or [place]. Pork trimmed of fat. All fish and seafood. Egg whites. Dried beans, peas, or lentils. Unsalted nuts or seeds. Unsalted canned beans. Nut butters without added sugar or oil. Dairy  Low-fat or nonfat dairy products, such as skim or 1% milk, 2% or reduced-fat cheeses, low-fat and fat-free ricotta or cottage cheese, or plain low-fat and nonfat yogurt. Fats and oils  Tub margarine without trans fats. Light or reduced-fat mayonnaise and salad dressings. Avocado. Olive, canola, sesame, or safflower oils. The items listed above may not be a complete list of foods and beverages you can eat. Contact a dietitian for more information. Foods to avoid Fruits  Canned fruit in heavy syrup. Fruit in cream or butter sauce. Fried fruit. Vegetables  Vegetables cooked in cheese, cream, or butter sauce. Fried vegetables. Grains  White bread. White pasta. White rice. Cornbread. Bagels, pastries, and croissants. Crackers and snack foods that contain trans fat and hydrogenated oils. Meats and other protein foods  Fatty cuts of meat. Ribs, chicken wings, bacon, sausage, bologna, salami, chitterlings,  fatback, hot dogs, bratwurst, and packaged lunch meats. Liver and organ meats. Whole eggs and egg yolks. Chicken and [place] with skin. Fried meat. Dairy  Whole or 2% milk, cream, half-and-half, and cream cheese. Whole milk cheeses. Whole-fat or sweetened yogurt. Full-fat cheeses. Nondairy creamers and whipped toppings. Processed cheese, cheese spreads, and cheese curds. Beverages  Alcohol. Sugar-sweetened drinks such as sodas, lemonade, and fruit drinks. Fats and oils  Butter, stick margarine, lard, shortening, ghee, or bacon fat. Coconut, palm kernel, and palm oils. Sweets and desserts  Corn syrup, sugars, honey, and molasses. Candy. Jam and jelly. Syrup. Sweetened cereals. Cookies, pies, cakes, donuts, muffins, and ice cream. The items listed above may not be a complete list of foods and beverages you should avoid. Contact a dietitian for more information. Summary  Choosing the right foods helps keep your fat and cholesterol at normal levels. This can keep you from getting certain diseases.  At meals, fill one-half of your plate with vegetables and green salads.  Eat high-fiber foods, like whole grains, beans, apples, carrots, peas, and barley.  Limit added sugar, saturated fats, alcohol, and fried foods. This information is not intended to replace advice given to you by your health care provider. Make sure you discuss any questions you have with your health care provider. Document Revised: 10/04/2017 Document Reviewed: 10/18/2016 Elsevier Patient Education  2020 [employer].

## 2019-08-23 NOTE — Assessment & Plan Note (Signed)
Her  random glucose is not  elevated but her A1c is in the prediabetic range.  she remains at risk for developing diabetes she is following a  low glycemic index diet and particpating regularly in an aerobic  exercise activity.  We will check an A1c and fasting lipids every  6 months.   Lab Results  Component Value Date   HGBA1C 6.2 12/21/2018   Lab Results  Component Value Date   MICROALBUR <0.7 11/14/2016

## 2019-08-23 NOTE — Assessment & Plan Note (Signed)
Her ability to exercise is limited by her chronic back pain and RAD.  Discussed diet,  Lifestyle, options for exercise.  Needs appetite suppression.  I have authorized the use of phentermine for 3 months  and a  return to see me in 3 months. Goal weight loss is 5% of today's weight,  Or 11 lbs  by then or phentermine will be discontinued.

## 2019-08-23 NOTE — Assessment & Plan Note (Addendum)
She underwent bilateral excisional biopsies at [facility] recently that were benign and has been released by surgical oncology to follow up with annual mammograms  

## 2019-08-23 NOTE — Assessment & Plan Note (Signed)
Well controlled on current regimen. Renal function stable, no changes today. 

## 2019-10-24 ENCOUNTER — Other Ambulatory Visit: Payer: Self-pay | Admitting: Internal Medicine

## 2019-11-04 ENCOUNTER — Other Ambulatory Visit: Payer: Self-pay | Admitting: Internal Medicine

## 2019-11-21 ENCOUNTER — Ambulatory Visit: Payer: BC Managed Care – PPO | Admitting: Internal Medicine

## 2019-12-13 ENCOUNTER — Other Ambulatory Visit: Payer: Self-pay

## 2019-12-13 ENCOUNTER — Encounter: Payer: Self-pay | Admitting: Internal Medicine

## 2019-12-13 ENCOUNTER — Ambulatory Visit (INDEPENDENT_AMBULATORY_CARE_PROVIDER_SITE_OTHER): Payer: BC Managed Care – PPO | Admitting: Internal Medicine

## 2019-12-13 VITALS — BP 120/82 | HR 75 | Temp 98.2°F | Ht 63.0 in | Wt 218.2 lb

## 2019-12-13 DIAGNOSIS — E669 Obesity, unspecified: Secondary | ICD-10-CM

## 2019-12-13 DIAGNOSIS — R7303 Prediabetes: Secondary | ICD-10-CM | POA: Diagnosis not present

## 2019-12-13 DIAGNOSIS — E7800 Pure hypercholesterolemia, unspecified: Secondary | ICD-10-CM | POA: Diagnosis not present

## 2019-12-13 MED ORDER — PHENTERMINE HCL 37.5 MG PO TABS: 37.5000 mg | ORAL_TABLET | Freq: Every day | ORAL | 2 refills | Status: DC

## 2019-12-13 NOTE — Progress Notes (Signed)
Subjective:  Patient ID: Victoria Hardin, female    DOB: 12-18-1965  Age: 54 y.o. MRN: [id number]  CC: The primary encounter diagnosis was Prediabetes. Diagnoses of Pure hypercholesterolemia and Obesity (BMI 30-39.9) were also pertinent to this visit.  HPI Victoria Hardin presents for  3 month follow up on hypertension and medication management of obesity  July 7: phentermine prescribed,.  Starting weight 215.  Goal wt loss by today  Was 11 lbs.   Today's weight 218.  She agrees that by her some scales she has not gained weight.   Cites stress as the cause  Father is 4 and lives with her and is losing his sight due to glaucoma and cataracts. Completely blind right eye  "I don't eat a whole lot."  Not exercising .  Discussed ways to exercise at home.  She recalls that she has an exercise  video that she can use.    Diet reviewed: Breakfast is a Smoothie:  1 banana and 1 cup of strawberries, , ice and 1 tsp  honey .   Lunch:  [place] sandwich,  Some potato chips .  Dinner : baked protein.  1/2 cup rice 1 yeast roll.  Broccoli , carrots ,  Okra , green beans       Outpatient Medications Prior to Visit  Medication Sig Dispense Refill  . diphenhydrAMINE (BENADRYL) 25 MG tablet Take 25 mg by mouth every 6 (six) hours as needed.    . hydrochlorothiazide (HYDRODIURIL) 25 MG tablet Take 1 tablet by mouth once daily 90 tablet 0  . loratadine (CLARITIN) 10 MG tablet Take 10 mg by mouth daily.    Marland Kitchen losartan (COZAAR) 100 MG tablet Take 1 tablet by mouth once daily 90 tablet 0  . Multiple Vitamins-Minerals (MULTIVITAMIN WITH MINERALS) tablet Take 1 tablet by mouth daily.    Marland Kitchen omeprazole (PRILOSEC) 20 MG capsule Take 1 capsule (20 mg total) by mouth daily. 30 capsule 3  . PULMICORT FLEXHALER 90 MCG/ACT inhaler INHALE 1 PUFF BY MOUTH TWICE DAILY 1 each 0  . phentermine (ADIPEX-P) 37.5 MG tablet Take 1 tablet (37.5 mg total) by mouth daily before breakfast. 30 tablet 2   No facility-administered  medications prior to visit.    Review of Systems;  Patient denies headache, fevers, malaise, unintentional weight loss, skin rash, eye pain, sinus congestion and sinus pain, sore throat, dysphagia,  hemoptysis , cough, dyspnea, wheezing, chest pain, palpitations, orthopnea, edema, abdominal pain, nausea, melena, diarrhea, constipation, flank pain, dysuria, hematuria, urinary  Frequency, nocturia, numbness, tingling, seizures,  Focal weakness, Loss of consciousness,  Tremor, insomnia, depression, anxiety, and suicidal ideation.      Objective:  BP 120/82   Pulse 75   Temp 98.2 F (36.8 C)   Ht 5\' 3"  (1.6 m)   Wt 218 lb 3.2 oz (99 kg)   SpO2 98%   BMI 38.65 kg/m   BP Readings from Last 3 Encounters:  12/13/19 120/82  08/21/19 104/70  05/01/19 128/78    Wt Readings from Last 3 Encounters:  12/13/19 218 lb 3.2 oz (99 kg)  08/21/19 215 lb 12.8 oz (97.9 kg)  05/01/19 220 lb (99.8 kg)    General appearance: alert, cooperative and appears stated age Ears: normal TM's and external ear canals both ears Throat: lips, mucosa, and tongue normal; teeth and gums normal Neck: no adenopathy, no carotid bruit, supple, symmetrical, trachea midline and thyroid not enlarged, symmetric, no tenderness/mass/nodules Back: symmetric, no curvature. ROM normal. No CVA tenderness.  Lungs: clear to auscultation bilaterally Heart: regular rate and rhythm, S1, S2 normal, no murmur, click, rub or gallop Abdomen: soft, non-tender; bowel sounds normal; no masses,  no organomegaly Pulses: 2+ and symmetric Skin: Skin color, texture, turgor normal. No rashes or lesions Lymph nodes: Cervical, supraclavicular, and axillary nodes normal.  Lab Results  Component Value Date   HGBA1C 6.2 12/21/2018   HGBA1C 6.2 09/11/2017   HGBA1C 6.3 02/16/2017    Lab Results  Component Value Date   CREATININE 0.79 12/21/2018   CREATININE 0.92 09/11/2017   CREATININE 0.79 02/16/2017    Lab Results  Component Value  Date   WBC 6.7 09/11/2017   HGB 12.0 09/11/2017   HCT 36.8 09/11/2017   PLT 211.0 09/11/2017   GLUCOSE 84 12/21/2018   CHOL 201 (H) 12/21/2018   TRIG 107.0 12/21/2018   HDL 49.20 12/21/2018   LDLDIRECT 119.0 11/14/2016   LDLCALC 130 (H) 12/21/2018   ALT 9 12/21/2018   AST 11 12/21/2018   NA 139 12/21/2018   K 3.6 12/21/2018   CL 103 12/21/2018   CREATININE 0.79 12/21/2018   BUN 15 12/21/2018   CO2 30 12/21/2018   TSH 0.99 06/06/2016   HGBA1C 6.2 12/21/2018   MICROALBUR <0.7 11/14/2016    MM DIAG BREAST TOMO BILATERAL  Result Date: 03/22/2019 CLINICAL DATA:  54 year old female with history bilateral breast stereotactic biopsies in December of 2019. Pathology results indicated complex sclerosing lesions bilaterally. EXAM: DIGITAL DIAGNOSTIC BILATERAL MAMMOGRAM WITH CAD AND TOMO COMPARISON:  Previous exam(s). ACR Breast Density Category c: The breast tissue is heterogeneously dense, which may obscure small masses. FINDINGS: The distortion in the right breast which is about 1.5 cm lateral to the coil biopsy marking clip is stable in appearance. No significant interval change is seen at the site of biopsy at the coil shaped biopsy marking clip in the superior central left breast. No new suspicious calcifications, masses or areas of distortion are seen in the bilateral breasts. Mammographic images were processed with CAD. IMPRESSION: 1. No significant interval change in the appearance of the bilateral previously biopsied areas of distortion (complex sclerosing lesions). No new findings in the bilateral breasts. RECOMMENDATION: Surgical consultation is recommended for the bilateral complex sclerosing lesions. The patient states that she would like a referral to [facility], which will need to be arranged by her physician. I have sent a epic message to Dr. Darrick Huntsman on 03/22/19 at 305pm to assist in arranging the referral. I have discussed the findings and recommendations with the patient. If applicable, a  reminder letter will be sent to the patient regarding the next appointment. BI-RADS CATEGORY  4: Suspicious. Electronically Signed   By: Frederico Hamman M.D.   On: 03/22/2019 15:13    Assessment & Plan:   Problem List Items Addressed This Visit      Unprioritized   Obesity (BMI 30-39.9)    She has had no change in weight despite using phentermine.  Dietary modification and emphasis on need for daily exercise done today.  Continue medication for 3 more months.  If no change medication will be stopped       Relevant Medications   phentermine (ADIPEX-P) 37.5 MG tablet   Hyperlipidemia   Relevant Orders   Lipid panel   Prediabetes - Primary   Relevant Orders   Hemoglobin A1c   Comprehensive metabolic panel     I provided  30 minutes of  face-to-face time during this encounter reviewing patient's current strategies for weight loss ,  and providing counseling on the above mentioned problems , and coordination  of care .  I am having Victoria Hardin maintain her multivitamin with minerals, diphenhydrAMINE, loratadine, omeprazole, Pulmicort Flexhaler, losartan, hydrochlorothiazide, and phentermine.  Meds ordered this encounter  Medications  . phentermine (ADIPEX-P) 37.5 MG tablet    Sig: Take 1 tablet (37.5 mg total) by mouth daily before breakfast.    Dispense:  30 tablet    Refill:  2    Medications Discontinued During This Encounter  Medication Reason  . phentermine (ADIPEX-P) 37.5 MG tablet Reorder    Follow-up: Return in about 3 months (around 03/14/2020).   Sherlene Shams, MD

## 2019-12-13 NOTE — Patient Instructions (Addendum)
The [employer]  Diet is a very successful diet,  But buying their food  costs $300 to $400   Think about using  Lean Cuisine and healty choice entrees instead of your own prepared dinner   MEASURE your potato chips!  No more than 100 calories !  If you don't switch to health Choice entrees,  Dinner should be grilled or baked protein ,  Green salad and a green veggie  NO RICE ,  NO ROLL  Return in 3 months. I have refilled the phentermine . PLEASE GET FASTING LABS PRIOR TO VISIT   11 lbs wt loss is your  Goal  Start exercising !  30 minutes on Saturday and on Sunday

## 2019-12-15 NOTE — Assessment & Plan Note (Signed)
She has had no change in weight despite using phentermine.  Dietary modification and emphasis on need for daily exercise done today.  Continue medication for 3 more months.  If no change medication will be stopped

## 2019-12-24 ENCOUNTER — Telehealth: Payer: Self-pay

## 2019-12-24 NOTE — Telephone Encounter (Signed)
PA for phentermine has been submitted on covermymeds.

## 2019-12-31 NOTE — Telephone Encounter (Signed)
Approved through 03/25/2020.

## 2020-01-29 ENCOUNTER — Encounter: Payer: Self-pay | Admitting: Internal Medicine

## 2020-01-29 ENCOUNTER — Other Ambulatory Visit: Payer: Self-pay | Admitting: Internal Medicine

## 2020-01-29 ENCOUNTER — Ambulatory Visit: Payer: BC Managed Care – PPO | Admitting: Internal Medicine

## 2020-01-29 ENCOUNTER — Other Ambulatory Visit: Payer: Self-pay

## 2020-01-29 ENCOUNTER — Ambulatory Visit (INDEPENDENT_AMBULATORY_CARE_PROVIDER_SITE_OTHER): Payer: BC Managed Care – PPO

## 2020-01-29 VITALS — BP 118/80 | HR 83 | Temp 98.4°F | Ht 62.99 in | Wt 216.0 lb

## 2020-01-29 DIAGNOSIS — R7303 Prediabetes: Secondary | ICD-10-CM | POA: Diagnosis not present

## 2020-01-29 DIAGNOSIS — E7800 Pure hypercholesterolemia, unspecified: Secondary | ICD-10-CM

## 2020-01-29 DIAGNOSIS — R140 Abdominal distension (gaseous): Secondary | ICD-10-CM

## 2020-01-29 DIAGNOSIS — E559 Vitamin D deficiency, unspecified: Secondary | ICD-10-CM

## 2020-01-29 DIAGNOSIS — R198 Other specified symptoms and signs involving the digestive system and abdomen: Secondary | ICD-10-CM

## 2020-01-29 DIAGNOSIS — E669 Obesity, unspecified: Secondary | ICD-10-CM

## 2020-01-29 MED ORDER — ZOSTER VAC RECOMB ADJUVANTED 50 MCG/0.5ML IM SUSR: 0.5000 mL | Freq: Once | INTRAMUSCULAR | 1 refills | Status: AC

## 2020-01-29 MED ORDER — PULMICORT FLEXHALER 90 MCG/ACT IN AEPB: 1.0000 | INHALATION_SPRAY | Freq: Two times a day (BID) | RESPIRATORY_TRACT | 0 refills | Status: DC

## 2020-01-29 NOTE — Progress Notes (Signed)
Subjective:  Patient ID: Victoria Hardin, female    DOB: [birth year]  Age: 53 y.o. MRN: [id number]  CC: The primary encounter diagnosis was Sensation of gaseous abdominal fullness. Diagnoses of Vitamin D deficiency, Obesity (BMI 30-39.9), Pure hypercholesterolemia, Prediabetes, and Abdominal fullness were also pertinent to this visit.  HPI Victoria Hardin presents for evaluation and treatment of constipation and abdominal fullness  above the navel   This visit occurred during the SARS-CoV-2 public health emergency.  Safety protocols were in place, including screening questions prior to the visit, additional usage of staff PPE, and extensive cleaning of exam room while observing appropriate contact time as indicated for disinfecting solutions.   Symptoms have been present for several days.  Moves her  bowels 1-2 times daily;  stools are regular consistency,  Caliber and color.  Denies nausea. Urine is pale yellow.  Has 3 voids daily,  None at night. Averages 32 ounces of water intake daily.  Appetite is normal.  3 meals daily   No unintentional weight loss.  Exercises 'a little" at night for about 30 mintues.    Feels fullness without pain  In the periumbilical area,  More prominent with sitting up . No color change or bulge.     Outpatient Medications Prior to Visit  Medication Sig Dispense Refill  . diphenhydrAMINE (BENADRYL) 25 MG tablet Take 25 mg by mouth every 6 (six) hours as needed.    . hydrochlorothiazide (HYDRODIURIL) 25 MG tablet Take 1 tablet by mouth once daily 90 tablet 0  . loratadine (CLARITIN) 10 MG tablet Take 10 mg by mouth daily.    Marland Kitchen losartan (COZAAR) 100 MG tablet Take 1 tablet by mouth once daily 90 tablet 0  . Multiple Vitamins-Minerals (MULTIVITAMIN WITH MINERALS) tablet Take 1 tablet by mouth daily.    Marland Kitchen omeprazole (PRILOSEC) 20 MG capsule Take 1 capsule (20 mg total) by mouth daily. 30 capsule 3  . phentermine (ADIPEX-P) 37.5 MG tablet Take 1 tablet (37.5 mg total) by  mouth daily before breakfast. 30 tablet 2  . PULMICORT FLEXHALER 90 MCG/ACT inhaler INHALE 1 PUFF BY MOUTH TWICE DAILY 1 each 0   No facility-administered medications prior to visit.    Review of Systems;  Patient denies headache, fevers, malaise, unintentional weight loss, skin rash, eye pain, sinus congestion and sinus pain, sore throat, dysphagia,  hemoptysis , cough, dyspnea, wheezing, chest pain, palpitations, orthopnea, edema, abdominal pain, nausea, melena, diarrhea, constipation, flank pain, dysuria, hematuria, urinary  Frequency, nocturia, numbness, tingling, seizures,  Focal weakness, Loss of consciousness,  Tremor, insomnia, depression, anxiety, and suicidal ideation.      Objective:  BP 118/80 (BP Location: Left Arm, Patient Position: Sitting)   Pulse 83   Temp 98.4 F (36.9 C)   Ht 5' 2.99" (1.6 m)   Wt 216 lb (98 kg)   SpO2 97%   BMI 38.27 kg/m   BP Readings from Last 3 Encounters:  01/29/20 118/80  12/13/19 120/82  08/21/19 104/70    Wt Readings from Last 3 Encounters:  01/29/20 216 lb (98 kg)  12/13/19 218 lb 3.2 oz (99 kg)  08/21/19 215 lb 12.8 oz (97.9 kg)    General appearance: alert, cooperative and appears stated age Ears: normal TM's and external ear canals both ears Throat: lips, mucosa, and tongue normal; teeth and gums normal Neck: no adenopathy, no carotid bruit, supple, symmetrical, trachea midline and thyroid not enlarged, symmetric, no tenderness/mass/nodules Back: symmetric, no curvature. ROM normal. No CVA tenderness. Lungs:  clear to auscultation bilaterally Heart: regular rate and rhythm, S1, S2 normal, no murmur, click, rub or gallop Abdomen: soft, non-tender; bowel sounds normal; no masses,  no organomegaly Pulses: 2+ and symmetric Skin: Skin color, texture, turgor normal. No rashes or lesions Lymph nodes: Cervical, supraclavicular, and axillary nodes normal.  Lab Results  Component Value Date   HGBA1C 6.0 01/29/2020   HGBA1C 6.2  12/21/2018   HGBA1C 6.2 09/11/2017    Lab Results  Component Value Date   CREATININE 0.95 01/29/2020   CREATININE 0.79 12/21/2018   CREATININE 0.92 09/11/2017    Lab Results  Component Value Date   WBC 6.7 09/11/2017   HGB 12.0 09/11/2017   HCT 36.8 09/11/2017   PLT 211.0 09/11/2017   GLUCOSE 84 01/29/2020   CHOL 210 (H) 01/29/2020   TRIG 148.0 01/29/2020   HDL 49.30 01/29/2020   LDLDIRECT 119.0 11/14/2016   LDLCALC 132 (H) 01/29/2020   ALT 11 01/29/2020   AST 15 01/29/2020   NA 140 01/29/2020   K 3.6 01/29/2020   CL 104 01/29/2020   CREATININE 0.95 01/29/2020   BUN 15 01/29/2020   CO2 30 01/29/2020   TSH 1.15 01/29/2020   HGBA1C 6.0 01/29/2020   MICROALBUR <0.7 11/14/2016    MM DIAG BREAST TOMO BILATERAL  Result Date: 03/22/2019 CLINICAL DATA:  54 year old female with history bilateral breast stereotactic biopsies in December of 2019. Pathology results indicated complex sclerosing lesions bilaterally. EXAM: DIGITAL DIAGNOSTIC BILATERAL MAMMOGRAM WITH CAD AND TOMO COMPARISON:  Previous exam(s). ACR Breast Density Category c: The breast tissue is heterogeneously dense, which may obscure small masses. FINDINGS: The distortion in the right breast which is about 1.5 cm lateral to the coil biopsy marking clip is stable in appearance. No significant interval change is seen at the site of biopsy at the coil shaped biopsy marking clip in the superior central left breast. No new suspicious calcifications, masses or areas of distortion are seen in the bilateral breasts. Mammographic images were processed with CAD. IMPRESSION: 1. No significant interval change in the appearance of the bilateral previously biopsied areas of distortion (complex sclerosing lesions). No new findings in the bilateral breasts. RECOMMENDATION: Surgical consultation is recommended for the bilateral complex sclerosing lesions. The patient states that she would like a referral to [facility], which will need to be arranged  by her physician. I have sent a epic message to Dr. Darrick Huntsman on 03/22/19 at 305pm to assist in arranging the referral. I have discussed the findings and recommendations with the patient. If applicable, a reminder letter will be sent to the patient regarding the next appointment. BI-RADS CATEGORY  4: Suspicious. Electronically Signed   By: Frederico Hamman M.D.   On: 03/22/2019 15:13    Assessment & Plan:   Problem List Items Addressed This Visit      Unprioritized   Obesity (BMI 30-39.9)   Hyperlipidemia   Relevant Orders   Lipid panel (Completed)   TSH (Completed)   Vitamin D deficiency   Prediabetes   Relevant Orders   Comprehensive metabolic panel (Completed)   Hemoglobin A1c (Completed)   Abdominal fullness    Exam is normal.  Plain  X rays note a normal stool /bowel gas pattern.  She has a history of laparascopic surgery so she may have an early hernia.  gen surg consults vs CT abd and pelvis first. Nothing to suggest a gastric ulcer (no pain  Vomiting,  Weight loss)        Other Visit  Diagnoses    Sensation of gaseous abdominal fullness    -  Primary   Relevant Orders   Comprehensive metabolic panel (Completed)      I have changed Victoria Hardin's Pulmicort Flexhaler. I am also having her start on Zoster Vaccine Adjuvanted. Additionally, I am having her maintain her multivitamin with minerals, diphenhydrAMINE, loratadine, omeprazole, losartan, hydrochlorothiazide, and phentermine.  Meds ordered this encounter  Medications  . Zoster Vaccine Adjuvanted [facility]) injection    Sig: Inject 0.5 mLs into the muscle once for 1 dose.    Dispense:  1 each    Refill:  1  . Budesonide (PULMICORT FLEXHALER) 90 MCG/ACT inhaler    Sig: Inhale 1 puff into the lungs 2 (two) times daily.    Dispense:  1 each    Refill:  0    Medications Discontinued During This Encounter  Medication Reason  . PULMICORT FLEXHALER 90 MCG/ACT inhaler Reorder    Follow-up: Return in about 4 weeks  (around 02/26/2020).   Sherlene Shams, MD

## 2020-01-29 NOTE — Patient Instructions (Addendum)
You may have a small ventral hernia that has occurred in the scar from your hysterectomy  If you are constipated ,  That will aggravate  It  If the plain films are normal,  We will reevaluate you in a month when you return for your PAP smear

## 2020-01-30 LAB — COMPREHENSIVE METABOLIC PANEL
ALT: 11 U/L (ref 0–35)
AST: 15 U/L (ref 0–37)
Albumin: 4.1 g/dL (ref 3.5–5.2)
Alkaline Phosphatase: 93 U/L (ref 39–117)
BUN: 15 mg/dL (ref 6–23)
CO2: 30 mEq/L (ref 19–32)
Calcium: 9.6 mg/dL (ref 8.4–10.5)
Chloride: 104 mEq/L (ref 96–112)
Creatinine, Ser: 0.95 mg/dL (ref 0.40–1.20)
GFR: 67.42 mL/min (ref >60.00)
Glucose, Bld: 84 mg/dL (ref 70–99)
Potassium: 3.6 mEq/L (ref 3.5–5.1)
Sodium: 140 mEq/L (ref 135–145)
Total Bilirubin: 0.4 mg/dL (ref 0.2–1.2)
Total Protein: 7.2 g/dL (ref 6.0–8.3)

## 2020-01-30 LAB — HEMOGLOBIN A1C: Hgb A1c MFr Bld: 6 % (ref 4.6–6.5)

## 2020-01-30 LAB — LIPID PANEL
Cholesterol: 210 mg/dL — ABNORMAL HIGH (ref 0–200)
HDL: 49.3 mg/dL (ref >39.00)
LDL Cholesterol: 132 mg/dL — ABNORMAL HIGH (ref 0–99)
NonHDL: 161.18
Total CHOL/HDL Ratio: 4
Triglycerides: 148 mg/dL (ref 0.0–149.0)
VLDL: 29.6 mg/dL (ref 0.0–40.0)

## 2020-01-30 LAB — TSH: TSH: 1.15 u[IU]/mL (ref 0.35–4.50)

## 2020-01-31 NOTE — Progress Notes (Signed)
Your plain abdominal film did not suggest that constipation is causing your symptom.  If you would like to rule out a hernia , I will need to order a CT scan before you  see a general surgeon . Let me  now if you want to proceed.  Regards,   Duncan Dull, MD

## 2020-02-01 ENCOUNTER — Encounter: Payer: Self-pay | Admitting: Internal Medicine

## 2020-02-01 DIAGNOSIS — K439 Ventral hernia without obstruction or gangrene: Secondary | ICD-10-CM | POA: Insufficient documentation

## 2020-02-01 NOTE — Assessment & Plan Note (Addendum)
Exam is normal.  Plain  X rays note a normal stool /bowel gas pattern.  She has a history of laparascopic surgery so she may have an early hernia.  gen surg consults vs CT abd and pelvis first. Nothing to suggest a gastric ulcer (no pain  Vomiting,  Weight loss)

## 2020-02-01 NOTE — Assessment & Plan Note (Signed)
she remains at risk for developing diabetes she is following a  low glycemic index diet and particpating regularly in an aerobic  exercise activity.  We will check an A1c and fasting lipids every  6 months.   Lab Results  Component Value Date   HGBA1C 6.0 01/29/2020   Lab Results  Component Value Date   MICROALBUR <0.7 11/14/2016

## 2020-02-05 ENCOUNTER — Other Ambulatory Visit: Payer: Self-pay | Admitting: Internal Medicine

## 2020-03-04 ENCOUNTER — Encounter: Payer: BC Managed Care – PPO | Admitting: Internal Medicine

## 2020-03-12 ENCOUNTER — Other Ambulatory Visit: Payer: Self-pay

## 2020-03-12 ENCOUNTER — Other Ambulatory Visit (INDEPENDENT_AMBULATORY_CARE_PROVIDER_SITE_OTHER): Payer: BC Managed Care – PPO

## 2020-03-12 DIAGNOSIS — E7800 Pure hypercholesterolemia, unspecified: Secondary | ICD-10-CM | POA: Diagnosis not present

## 2020-03-12 DIAGNOSIS — R7303 Prediabetes: Secondary | ICD-10-CM

## 2020-03-12 LAB — HEMOGLOBIN A1C: Hgb A1c MFr Bld: 6.1 % (ref 4.6–6.5)

## 2020-03-12 LAB — COMPREHENSIVE METABOLIC PANEL
ALT: 11 U/L (ref 0–35)
AST: 14 U/L (ref 0–37)
Albumin: 4.1 g/dL (ref 3.5–5.2)
Alkaline Phosphatase: 91 U/L (ref 39–117)
BUN: 16 mg/dL (ref 6–23)
CO2: 32 mEq/L (ref 19–32)
Calcium: 9.8 mg/dL (ref 8.4–10.5)
Chloride: 101 mEq/L (ref 96–112)
Creatinine, Ser: 0.92 mg/dL (ref 0.40–1.20)
GFR: 70.01 mL/min (ref >60.00)
Glucose, Bld: 87 mg/dL (ref 70–99)
Potassium: 3.6 mEq/L (ref 3.5–5.1)
Sodium: 138 mEq/L (ref 135–145)
Total Bilirubin: 0.6 mg/dL (ref 0.2–1.2)
Total Protein: 7.3 g/dL (ref 6.0–8.3)

## 2020-03-12 LAB — LIPID PANEL
Cholesterol: 221 mg/dL — ABNORMAL HIGH (ref 0–200)
HDL: 49.8 mg/dL (ref >39.00)
LDL Cholesterol: 148 mg/dL — ABNORMAL HIGH (ref 0–99)
NonHDL: 171
Total CHOL/HDL Ratio: 4
Triglycerides: 116 mg/dL (ref 0.0–149.0)
VLDL: 23.2 mg/dL (ref 0.0–40.0)

## 2020-03-16 ENCOUNTER — Encounter: Payer: Self-pay | Admitting: Internal Medicine

## 2020-03-20 ENCOUNTER — Other Ambulatory Visit: Payer: Self-pay

## 2020-03-20 ENCOUNTER — Ambulatory Visit (INDEPENDENT_AMBULATORY_CARE_PROVIDER_SITE_OTHER): Payer: BC Managed Care – PPO | Admitting: Internal Medicine

## 2020-03-20 ENCOUNTER — Encounter: Payer: Self-pay | Admitting: Internal Medicine

## 2020-03-20 VITALS — BP 114/80 | HR 76 | Temp 97.7°F | Ht 62.99 in | Wt 218.8 lb

## 2020-03-20 DIAGNOSIS — E7800 Pure hypercholesterolemia, unspecified: Secondary | ICD-10-CM | POA: Diagnosis not present

## 2020-03-20 DIAGNOSIS — Z0000 Encounter for general adult medical examination without abnormal findings: Secondary | ICD-10-CM | POA: Diagnosis not present

## 2020-03-20 DIAGNOSIS — N6029 Fibroadenosis of unspecified breast: Secondary | ICD-10-CM | POA: Diagnosis not present

## 2020-03-20 DIAGNOSIS — I10 Essential (primary) hypertension: Secondary | ICD-10-CM

## 2020-03-20 DIAGNOSIS — Z90710 Acquired absence of both cervix and uterus: Secondary | ICD-10-CM

## 2020-03-20 DIAGNOSIS — K439 Ventral hernia without obstruction or gangrene: Secondary | ICD-10-CM

## 2020-03-20 DIAGNOSIS — R928 Other abnormal and inconclusive findings on diagnostic imaging of breast: Secondary | ICD-10-CM | POA: Diagnosis not present

## 2020-03-20 NOTE — Patient Instructions (Signed)
Your right hand numbness is being aggravated by your sleeping position  Keep your wrist in a neutral position at night when you sleep using a wrist guard   Try using the metamucil 30 minutes before your biggest meal to reduce your appetite   If you decided to retry the  Phentermine,    Please have your vital signs checked a week after starting, TO MAKE SURE IT IS NOT RAISING YOUR BLOOD PRESSURE   An alternative to phentermine would be the  injectible medications called Ozempic and Saxenda .  These are safer long term alternatives   Mammogram is due in May

## 2020-03-20 NOTE — Progress Notes (Signed)
Patient ID: Victoria Hardin, female    DOB: 1965/05/11  Age: 55 y.o. MRN: [id number]  The patient is here for annual preventive examination and management of other chronic and acute problems.  This visit occurred during the SARS-CoV-2 public health emergency.  Safety protocols were in place, including screening questions prior to the visit, additional usage of staff PPE, and extensive cleaning of exam room while observing appropriate contact time as indicated for disinfecting solutions.      The risk factors are reflected in the social history.  The roster of all physicians providing medical care to patient - is listed in the Snapshot section of the chart.  Activities of daily living:  The patient is 100% independent in all ADLs: dressing, toileting, feeding as well as independent mobility  Home safety : The patient has smoke detectors in the home. They wear seatbelts.  There are no firearms at home. There is no violence in the home.   There is no risks for hepatitis, STDs or HIV. There is no   history of blood transfusion. They have no travel history to infectious disease endemic areas of the world.  The patient has seen their dentist in the last six month. They have seen their eye doctor in the last year. They HAVE NO hearing issues and have deferred audiologic testing in the last year.  They do not  have excessive sun exposure. Discussed the need for sun protection: hats, long sleeves and use of sunscreen if there is significant sun exposure.   Diet: the importance of a healthy diet is discussed. They do have a healthy diet.  The benefits of regular aerobic exercise were discussed. She walks 2 or 3  times per week ,  20 minutes.   Depression screen: there are no signs or vegative symptoms of depression- irritability, change in appetite, anhedonia, sadness/tearfullness.   The following portions of the patient's history were reviewed and updated as appropriate: allergies, current  medications, past family history, past medical history,  past surgical history, past social history  and problem list.  Visual acuity was not assessed per patient preference since she has regular follow up with her ophthalmologist. Hearing and body mass index were assessed and reviewed.   During the course of the visit the patient was educated and counseled about appropriate screening and preventive services including : fall prevention , diabetes screening, nutrition counseling, colorectal cancer screening, and recommended immunizations.    CC: Diagnoses of Encounter for preventive health examination, Abnormal mammogram of both breasts, Primary hypertension, Sclerosing adenosis of breast, unspecified laterality, Pure hypercholesterolemia, Ventral hernia without obstruction or gangrene, and S/P laparoscopic hysterectomy were pertinent to this visit.   1) Taking metamucil  Daily  For management of constipation  Has not tried taking it before dinner  2) Obesity : she Stopped phentermine due to recurrent headaches that started with initiation,  Did not check BP as directed during medication start.  Since then she has joined [employer] but does  Not have a routine yet.   3) Has felt a knot  In the abdomen,  In the area of prior surgeries.  Not tenden ,  Does not want surgical referral at this time  4) right hand index finger and thumb feel numb at times, woithout less of strength or pain.  No neck pain.  She is right handed  Types for 8 hours daily without a padded wrist support bar and sleeps on her side with wrist hyperflexed under head  5) breast biopsies done in May at [facility],  Benign.  Healing.  Resume annual mammogram in May   6) 5 yr follow up colonoscopy per [facility] due in 2023   History Victoria Hardin has a past medical history of History of kidney stones and Shingles outbreak.   She has a past surgical history that includes Abdominal hysterectomy and Breast surgery.   Her family history  includes Cancer in her maternal grandfather; Cancer (age of onset: 50) in her mother; Heart disease in her maternal grandmother.She reports that she has never smoked. She has never used smokeless tobacco. She reports current alcohol use of about 1.0 standard drink of alcohol per week. No history on file for drug use.  Outpatient Medications Prior to Visit  Medication Sig Dispense Refill  . Budesonide (PULMICORT FLEXHALER) 90 MCG/ACT inhaler Inhale 1 puff into the lungs 2 (two) times daily. 1 each 0  . diphenhydrAMINE (BENADRYL) 25 MG tablet Take 25 mg by mouth every 6 (six) hours as needed.    . hydrochlorothiazide (HYDRODIURIL) 25 MG tablet Take 1 tablet by mouth once daily 90 tablet 0  . loratadine (CLARITIN) 10 MG tablet Take 10 mg by mouth daily.    Marland Kitchen losartan (COZAAR) 100 MG tablet Take 1 tablet by mouth once daily 90 tablet 0  . Multiple Vitamins-Minerals (MULTIVITAMIN WITH MINERALS) tablet Take 1 tablet by mouth daily.    Marland Kitchen omeprazole (PRILOSEC) 20 MG capsule Take 1 capsule (20 mg total) by mouth daily. 30 capsule 3  . phentermine (ADIPEX-P) 37.5 MG tablet Take 1 tablet (37.5 mg total) by mouth daily before breakfast. 30 tablet 2   No facility-administered medications prior to visit.    Review of Systems   Patient denies headache, fevers, malaise, unintentional weight loss, skin rash, eye pain, sinus congestion and sinus pain, sore throat, dysphagia,  hemoptysis , cough, dyspnea, wheezing, chest pain, palpitations, orthopnea, edema, abdominal pain, nausea, melena, diarrhea, constipation, flank pain, dysuria, hematuria, urinary  Frequency, nocturia, numbness, tingling, seizures,  Focal weakness, Loss of consciousness,  Tremor, insomnia, depression, anxiety, and suicidal ideation.      Objective:  BP 114/80 (BP Location: Left Arm, Patient Position: Sitting)   Pulse 76   Temp 97.7 F (36.5 C)   Ht 5' 2.99" (1.6 m)   Wt 218 lb 12.8 oz (99.2 kg)   SpO2 96%   BMI 38.77 kg/m    Physical Exam General appearance: alert, cooperative and appears stated age Ears: normal TM's and external ear canals both ears Throat: lips, mucosa, and tongue normal; teeth and gums normal Neck: no adenopathy, no carotid bruit, supple, symmetrical, trachea midline and thyroid not enlarged, symmetric, no tenderness/mass/nodules Back: symmetric, no curvature. ROM normal. No CVA tenderness. Lungs: clear to auscultation bilaterally Heart: regular rate and rhythm, S1, S2 normal, no murmur, click, rub or gallop Abdomen: soft, non-tender; bowel sounds normal; small  nontender irreducible hernia left lower quadrant .  no masses,  no organomegaly Pulses: 2+ and symmetric Skin: Skin color, texture, turgor normal. No rashes or lesions MSK:  Right hand without thenar wasting or deficits. Sensation intact to microfilament.  Lymph nodes: Cervical, supraclavicular, and axillary nodes normal.   Assessment & Plan:   Problem List Items Addressed This Visit      Unprioritized   Encounter for preventive health examination    age appropriate education and counseling updated, referrals for preventative services and immunizations addressed, dietary and smoking counseling addressed, most recent labs reviewed.  I have personally reviewed and  have noted:  1) the patient's medical and social history 2) The pt's use of alcohol, tobacco, and illicit drugs 3) The patient's current medications and supplements 4) Functional ability including ADL's, fall risk, home safety risk, hearing and visual impairment 5) Diet and physical activities 6) Evidence for depression or mood disorder 7) The patient's height, weight, and BMI have been recorded in the chart  I have made referrals, and provided counseling and education based on review of the above      Hyperlipidemia    10 yr risk of CAD using the AHA calculator is 4%.  No treatment advised    Lab Results  Component Value Date   CHOL 221 (H) 03/12/2020    HDL 49.80 03/12/2020   LDLCALC 148 (H) 03/12/2020   LDLDIRECT 119.0 11/14/2016   TRIG 116.0 03/12/2020   CHOLHDL 4 03/12/2020         Hypertension    Well controlled on current regimen of losartan 100 mg and hctz 25 mg daily  Renal function stable, no changes today.  Lab Results  Component Value Date   CREATININE 0.92 03/12/2020   Lab Results  Component Value Date   NA 138 03/12/2020   K 3.6 03/12/2020   CL 101 03/12/2020   CO2 32 03/12/2020         S/P laparoscopic hysterectomy   Sclerosing adenosis of breast    She underwent bilateral excisional biopsies at [facility] recently that were benign and has been released by surgical oncology to follow up with annual mammograms       Ventral hernia    Occurring in the area of prior trochar placement during hysterectomy  She is not tender in the area noted and has deferred surgical consult for now.        Other Visit Diagnoses    Abnormal mammogram of both breasts          I am having [name] maintain her multivitamin with minerals, diphenhydrAMINE, loratadine, omeprazole, phentermine, Pulmicort Flexhaler, losartan, and hydrochlorothiazide.  No orders of the defined types were placed in this encounter.   There are no discontinued medications.  Follow-up: No follow-ups on file.   Sherlene Shams, MD

## 2020-03-22 ENCOUNTER — Encounter: Payer: Self-pay | Admitting: Internal Medicine

## 2020-03-22 DIAGNOSIS — Z90710 Acquired absence of both cervix and uterus: Secondary | ICD-10-CM | POA: Insufficient documentation

## 2020-03-22 NOTE — Assessment & Plan Note (Signed)
She underwent bilateral excisional biopsies at [facility] recently that were benign and has been released by surgical oncology to follow up with annual mammograms

## 2020-03-22 NOTE — Assessment & Plan Note (Signed)
S/p bilateral biopsies in May 2021 , both benign. Advised to return to annual screening,  Which we will do in May 2022

## 2020-03-22 NOTE — Assessment & Plan Note (Signed)
Occurring in the area of prior trochar placement during hysterectomy  She is not tender in the area noted and has deferred surgical consult for now.

## 2020-03-22 NOTE — Assessment & Plan Note (Signed)
10 yr risk of CAD using the AHA calculator is 4%.  No treatment advised    Lab Results  Component Value Date   CHOL 221 (H) 03/12/2020   HDL 49.80 03/12/2020   LDLCALC 148 (H) 03/12/2020   LDLDIRECT 119.0 11/14/2016   TRIG 116.0 03/12/2020   CHOLHDL 4 03/12/2020

## 2020-03-22 NOTE — Assessment & Plan Note (Signed)

## 2020-03-22 NOTE — Assessment & Plan Note (Signed)
Well controlled on current regimen of losartan 100 mg and hctz 25 mg daily  Renal function stable, no changes today.  Lab Results  Component Value Date   CREATININE 0.92 03/12/2020   Lab Results  Component Value Date   NA 138 03/12/2020   K 3.6 03/12/2020   CL 101 03/12/2020   CO2 32 03/12/2020

## 2020-04-17 ENCOUNTER — Other Ambulatory Visit: Payer: Self-pay | Admitting: Internal Medicine

## 2020-05-10 ENCOUNTER — Other Ambulatory Visit: Payer: Self-pay | Admitting: Internal Medicine

## 2020-06-15 ENCOUNTER — Other Ambulatory Visit: Payer: Self-pay | Admitting: Internal Medicine

## 2020-08-11 ENCOUNTER — Other Ambulatory Visit: Payer: Self-pay | Admitting: Internal Medicine

## 2021-01-25 ENCOUNTER — Other Ambulatory Visit: Payer: Self-pay | Admitting: Internal Medicine

## 2021-02-15 ENCOUNTER — Other Ambulatory Visit: Payer: Self-pay | Admitting: Internal Medicine

## 2021-02-16 ENCOUNTER — Encounter: Payer: Self-pay | Admitting: Internal Medicine

## 2021-05-16 ENCOUNTER — Other Ambulatory Visit: Payer: Self-pay | Admitting: Internal Medicine

## 2021-05-31 ENCOUNTER — Encounter: Payer: Self-pay | Admitting: Internal Medicine

## 2021-05-31 ENCOUNTER — Ambulatory Visit (INDEPENDENT_AMBULATORY_CARE_PROVIDER_SITE_OTHER): Payer: BC Managed Care – PPO | Admitting: Internal Medicine

## 2021-05-31 VITALS — BP 116/72 | HR 70 | Temp 97.9°F | Ht 62.0 in | Wt 223.4 lb

## 2021-05-31 DIAGNOSIS — E7800 Pure hypercholesterolemia, unspecified: Secondary | ICD-10-CM | POA: Diagnosis not present

## 2021-05-31 DIAGNOSIS — J683 Other acute and subacute respiratory conditions due to chemicals, gases, fumes and vapors: Secondary | ICD-10-CM | POA: Diagnosis not present

## 2021-05-31 DIAGNOSIS — Z124 Encounter for screening for malignant neoplasm of cervix: Secondary | ICD-10-CM

## 2021-05-31 DIAGNOSIS — R7303 Prediabetes: Secondary | ICD-10-CM | POA: Diagnosis not present

## 2021-05-31 DIAGNOSIS — E669 Obesity, unspecified: Secondary | ICD-10-CM | POA: Diagnosis not present

## 2021-05-31 DIAGNOSIS — Z79899 Other long term (current) drug therapy: Secondary | ICD-10-CM | POA: Diagnosis not present

## 2021-05-31 DIAGNOSIS — I10 Essential (primary) hypertension: Secondary | ICD-10-CM

## 2021-05-31 DIAGNOSIS — Z1231 Encounter for screening mammogram for malignant neoplasm of breast: Secondary | ICD-10-CM

## 2021-05-31 DIAGNOSIS — Z0000 Encounter for general adult medical examination without abnormal findings: Secondary | ICD-10-CM | POA: Diagnosis not present

## 2021-05-31 MED ORDER — PULMICORT FLEXHALER 90 MCG/ACT IN AEPB
1.0000 | INHALATION_SPRAY | Freq: Two times a day (BID) | RESPIRATORY_TRACT | 11 refills | Status: AC
Start: 2021-05-31 — End: ?

## 2021-05-31 MED ORDER — OMEPRAZOLE 20 MG PO CPDR: 20.0000 mg | DELAYED_RELEASE_CAPSULE | Freq: Every day | ORAL | 3 refills | Status: DC

## 2021-05-31 NOTE — Patient Instructions (Signed)
Let me know when you start GoLo.  We'll plan to recheck your thyroid and liver enzymes a few weeks after you start it  ? ?[employer] "low carb power bowl"  entrees and  "Steamer" entrees are are great low carb entrees that microwave in 5 minutes    ? ?DON'T FORGET:  EXERCISE is half of the prescription for losing weight   ? ?  ?

## 2021-05-31 NOTE — Assessment & Plan Note (Signed)
Continue maintenance therapy with pulmicort  ?

## 2021-05-31 NOTE — Assessment & Plan Note (Signed)

## 2021-05-31 NOTE — Assessment & Plan Note (Signed)
Well controlled on current regimen of losartan 50 mg and hctz 25 mg daily Renal function stable, no changes today. ?

## 2021-05-31 NOTE — Progress Notes (Signed)
The patient is here for annual preventive examination and management of other chronic and acute problems. ?  ?The risk factors are reflected in the social history. ?  ?The roster of all physicians providing medical care to patient - is listed in the Snapshot section of the chart. ?  ?Activities of daily living:  The patient is 100% independent in all ADLs: dressing, toileting, feeding as well as independent mobility ?  ?Home safety : The patient has smoke detectors in the home. They wear seatbelts.  There are no unsecured firearms at home. There is no violence in the home.  ?  ?There is no risks for hepatitis, STDs or HIV. There is no   history of blood transfusion. They have no travel history to infectious disease endemic areas of the world. ?  ?The patient has seen their dentist in the last six month. They have seen their eye doctor in the last year. She does not   have excessive sun exposure. Discussed the need for sun protection: hats, long sleeves and use of sunscreen if there is significant sun exposure.  ?  ?Diet: the importance of a healthy diet is discussed. They do have a healthy diet. ?  ?The benefits of regular aerobic exercise were discussed. The patient  walks 3 to 5 days per week  for  30 minutes.  ?  ?Depression screen: there are no signs or vegative symptoms of depression- irritability, change in appetite, anhedonia, sadness/tearfullness. ?  ?The following portions of the patient's history were reviewed and updated as appropriate: allergies, current medications, past family history, past medical history,  past surgical history, past social history  and problem list. ?  ?Visual acuity was not assessed per patient preference since the patient has regular follow up with an  ophthalmologist. Hearing and body mass index were assessed and reviewed.  ?  ?During the course of the visit the patient was educated and counseled about appropriate screening and preventive services including : fall prevention ,  diabetes screening, nutrition counseling, colorectal cancer screening, and recommended immunizations.   ? ?Chief Complaint: ? ?1) Left knee, left hip pain.  Not aggravated by activity.  Worse when sleeping.   ? ?2) Weight gain : frustrated .  Current diet and exercise activities reviewed in detail . Often has ice cream or brownies for dessert . Not exercising regularly  ? ?Review of Symptoms ? ?Patient denies headache, fevers, malaise, unintentional weight loss, skin rash, eye pain, sinus congestion and sinus pain, sore throat, dysphagia,  hemoptysis , cough, dyspnea, wheezing, chest pain, palpitations, orthopnea, edema, abdominal pain, nausea, melena, diarrhea, constipation, flank pain, dysuria, hematuria, urinary  Frequency, nocturia, numbness, tingling, seizures,  Focal weakness, Loss of consciousness,  Tremor, insomnia, depression, anxiety, and suicidal ideation.   ? ?Physical Exam: ? ?BP 116/72 (BP Location: Left Arm, Patient Position: Sitting, Cuff Size: Large)   Pulse 70   Temp 97.9 ?F (36.6 ?C) (Oral)   Ht 5\' 2"  (1.575 m)   Wt 223 lb 6.4 oz (101.3 kg)   SpO2 98%   BMI 40.86 kg/m?   ? ?General appearance: alert, cooperative and appears stated age ?Head: Normocephalic, without obvious abnormality, atraumatic ?Eyes: conjunctivae/corneas clear. PERRL, EOM's intact. Fundi benign. ?Ears: normal TM's and external ear canals both ears ?Nose: Nares normal. Septum midline. Mucosa normal. No drainage or sinus tenderness. ?Throat: lips, mucosa, and tongue normal; teeth and gums normal ?Neck: no adenopathy, no carotid bruit, no JVD, supple, symmetrical, trachea midline and thyroid not enlarged,  symmetric, no tenderness/mass/nodules ?Lungs: clear to auscultation bilaterally ?Breasts: normal appearance, no masses or tenderness ?Heart: regular rate and rhythm, S1, S2 normal, no murmur, click, rub or gallop ?Abdomen: soft, non-tender; bowel sounds normal; no masses,  no organomegaly ?Extremities: extremities normal,  atraumatic, no cyanosis or edema ?Pulses: 2+ and symmetric ?Skin: Skin color, texture, turgor normal. No rashes or lesions ?Neurologic: Alert and oriented X 3, normal strength and tone. Normal symmetric reflexes. Normal coordination and gait.    ? ?Assessment and Plan: ? ?Reactive airways dysfunction syndrome (HCC) ?Continue maintenance therapy with pulmicort  ? ?Obesity (BMI 30-39.9) ?Current diet reviewed in detail.  She has been gaining weight despite reports of following a diet . Detailed review indicates that she is minimizing her reporting of sweets and not exercising. Planning to start GoLo , a supplement containing inulin and several proprietary nutraceuticals. Advised to repeat thyroid and liver enzymes after starting supplement  ? ?Encounter for preventive health examination ?age appropriate education and counseling updated, referrals for preventative services and immunizations addressed, dietary and smoking counseling addressed, most recent labs reviewed.  I have personally reviewed and have noted: ?  ?1) the patient's medical and social history ?2) The pt's use of alcohol, tobacco, and illicit drugs ?3) The patient's current medications and supplements ?4) Functional ability including ADL's, fall risk, home safety risk, hearing and visual impairment ?5) Diet and physical activities ?6) Evidence for depression or mood disorder ?7) The patient's height, weight, and BMI have been recorded in the chart  ? ?I have made referrals, and provided counseling and education based on review of the above ? ?Hypertension ?Well controlled on current regimen of losartan 50 mg and hctz 25 mg daily Renal function stable, no changes today. ? ?Prediabetes ? she remains at risk for developing diabetes and is not  following a  low glycemic index diet or particpating regularly in an aerobic  exercise activity. Repeat a1c is due ? ? ?Lab Results  ?Component Value Date  ? HGBA1C 6.1 03/12/2020  ? ?Lab Results  ?Component Value  Date  ? MICROALBUR <0.7 11/14/2016  ? ? ? ? ? ?Updated Medication List ?Outpatient Encounter Medications as of 05/31/2021  ?Medication Sig  ? diphenhydrAMINE (BENADRYL) 25 MG tablet Take 25 mg by mouth every 6 (six) hours as needed.  ? hydrochlorothiazide (HYDRODIURIL) 25 MG tablet Take 1 tablet by mouth once daily  ? loratadine (CLARITIN) 10 MG tablet Take 10 mg by mouth daily.  ? losartan (COZAAR) 50 MG tablet Take 2 tablets by mouth once daily  ? Multiple Vitamins-Minerals (MULTIVITAMIN WITH MINERALS) tablet Take 1 tablet by mouth daily.  ? omeprazole (PRILOSEC) 20 MG capsule Take 1 capsule (20 mg total) by mouth daily.  ? [DISCONTINUED] losartan (COZAAR) 100 MG tablet Take 1 tablet by mouth once daily  ? [DISCONTINUED] phentermine (ADIPEX-P) 37.5 MG tablet Take 1 tablet (37.5 mg total) by mouth daily before breakfast.  ? [DISCONTINUED] PULMICORT FLEXHALER 90 MCG/ACT inhaler INHALE 1 PUFF BY MOUTH TWICE DAILY  ? Budesonide (PULMICORT FLEXHALER) 90 MCG/ACT inhaler Inhale 1 puff into the lungs 2 (two) times daily.  ? [DISCONTINUED] omeprazole (PRILOSEC) 20 MG capsule Take 1 capsule (20 mg total) by mouth daily. (Patient not taking: Reported on 05/31/2021)  ? ?No facility-administered encounter medications on file as of 05/31/2021.  ?  ?

## 2021-05-31 NOTE — Assessment & Plan Note (Addendum)
Current diet reviewed in detail.  She has been gaining weight despite reports of following a diet . Detailed review indicates that she is minimizing her reporting of sweets and not exercising. Planning to start GoLo , a supplement containing inulin and several proprietary nutraceuticals. Advised to repeat thyroid and liver enzymes after starting supplement  ?

## 2021-06-01 LAB — COMPREHENSIVE METABOLIC PANEL
ALT: 10 U/L (ref 0–35)
AST: 14 U/L (ref 0–37)
Albumin: 4.2 g/dL (ref 3.5–5.2)
Alkaline Phosphatase: 87 U/L (ref 39–117)
BUN: 13 mg/dL (ref 6–23)
CO2: 31 mEq/L (ref 19–32)
Calcium: 9.9 mg/dL (ref 8.4–10.5)
Chloride: 99 mEq/L (ref 96–112)
Creatinine, Ser: 0.81 mg/dL (ref 0.40–1.20)
GFR: 80.88 mL/min (ref >60.00)
Glucose, Bld: 83 mg/dL (ref 70–99)
Potassium: 3.5 mEq/L (ref 3.5–5.1)
Sodium: 138 mEq/L (ref 135–145)
Total Bilirubin: 0.4 mg/dL (ref 0.2–1.2)
Total Protein: 7.3 g/dL (ref 6.0–8.3)

## 2021-06-01 LAB — CBC WITH DIFFERENTIAL/PLATELET
Basophils Absolute: 0.1 10*3/uL (ref 0.0–0.1)
Basophils Relative: 1.2 % (ref 0.0–3.0)
Eosinophils Absolute: 0.1 10*3/uL (ref 0.0–0.7)
Eosinophils Relative: 1.9 % (ref 0.0–5.0)
HCT: 38 % (ref 36.0–46.0)
Hemoglobin: 12.4 g/dL (ref 12.0–15.0)
Lymphocytes Relative: 47.6 % — ABNORMAL HIGH (ref 12.0–46.0)
Lymphs Abs: 2.1 10*3/uL (ref 0.7–4.0)
MCHC: 32.6 g/dL (ref 30.0–36.0)
MCV: 84.8 fl (ref 78.0–100.0)
Monocytes Absolute: 0.4 10*3/uL (ref 0.1–1.0)
Monocytes Relative: 9 % (ref 3.0–12.0)
Neutro Abs: 1.7 10*3/uL (ref 1.4–7.7)
Neutrophils Relative %: 40.3 % — ABNORMAL LOW (ref 43.0–77.0)
Platelets: 201 10*3/uL (ref 150.0–400.0)
RBC: 4.48 Mil/uL (ref 3.87–5.11)
RDW: 14.4 % (ref 11.5–15.5)
WBC: 4.3 10*3/uL (ref 4.0–10.5)

## 2021-06-01 LAB — MICROALBUMIN / CREATININE URINE RATIO
Creatinine,U: 134.8 mg/dL
Microalb Creat Ratio: 0.5 mg/g (ref 0.0–30.0)
Microalb, Ur: <0.7 mg/dL (ref 0.0–1.9)

## 2021-06-01 LAB — LIPID PANEL
Cholesterol: 214 mg/dL — ABNORMAL HIGH (ref 0–200)
HDL: 52 mg/dL (ref >39.00)
LDL Cholesterol: 140 mg/dL — ABNORMAL HIGH (ref 0–99)
NonHDL: 162.3
Total CHOL/HDL Ratio: 4
Triglycerides: 113 mg/dL (ref 0.0–149.0)
VLDL: 22.6 mg/dL (ref 0.0–40.0)

## 2021-06-01 LAB — LDL CHOLESTEROL, DIRECT: Direct LDL: 149 mg/dL

## 2021-06-01 LAB — HEMOGLOBIN A1C: Hgb A1c MFr Bld: 6.2 % (ref 4.6–6.5)

## 2021-06-01 LAB — TSH: TSH: 1.23 u[IU]/mL (ref 0.35–5.50)

## 2021-06-01 NOTE — Assessment & Plan Note (Signed)
she remains at risk for developing diabetes and is not  following a  low glycemic index diet or particpating regularly in an aerobic  exercise activity. Repeat a1c is due ? ? ?Lab Results  ?Component Value Date  ? HGBA1C 6.1 03/12/2020  ? ?Lab Results  ?Component Value Date  ? MICROALBUR <0.7 11/14/2016  ? ? ? ?

## 2021-08-16 ENCOUNTER — Other Ambulatory Visit: Payer: Self-pay | Admitting: Internal Medicine

## 2021-11-12 ENCOUNTER — Telehealth: Payer: Self-pay | Admitting: Family

## 2021-11-19 NOTE — Telephone Encounter (Signed)
Medication has been refilled.

## 2021-11-19 NOTE — Telephone Encounter (Signed)
Pt need refill on losartan and hydrochlorothiazide sent to [facility] [place]

## 2022-02-17 ENCOUNTER — Other Ambulatory Visit: Payer: Self-pay | Admitting: Internal Medicine

## 2022-02-22 ENCOUNTER — Ambulatory Visit: Payer: BC Managed Care – PPO | Admitting: Nurse Practitioner

## 2022-02-22 ENCOUNTER — Encounter: Payer: Self-pay | Admitting: Nurse Practitioner

## 2022-02-22 VITALS — BP 118/76 | HR 78 | Temp 98.8°F | Ht 62.0 in | Wt 222.0 lb

## 2022-02-22 DIAGNOSIS — G5603 Carpal tunnel syndrome, bilateral upper limbs: Secondary | ICD-10-CM

## 2022-02-22 MED ORDER — IBUPROFEN 800 MG PO TABS: 800.0000 mg | ORAL_TABLET | Freq: Three times a day (TID) | ORAL | 2 refills | Status: DC | PRN

## 2022-02-22 NOTE — Progress Notes (Signed)
  Tomasita Morrow, NP-C Phone: [phone]  Victoria Hardin is a 57 y.o. female who presents today for tingling in right thumb and index finger.  Patient reports a tingling sensation in her right index and thumb for approximately one year. She reports worsening symptoms with typing at work and driving. 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 She does wear splits, however she reports they are no longer helping. She reports the right hand being worse than the left. Denies any injury or weakness.   Social History   Tobacco Use  Smoking Status Never  Smokeless Tobacco Never    Current Outpatient Medications on File Prior to Visit  Medication Sig Dispense Refill   Budesonide (PULMICORT FLEXHALER) 90 MCG/ACT inhaler Inhale 1 puff into the lungs 2 (two) times daily. 1 each 11   diphenhydrAMINE (BENADRYL) 25 MG tablet Take 25 mg by mouth every 6 (six) hours as needed.     hydrochlorothiazide (HYDRODIURIL) 25 MG tablet Take 1 tablet by mouth once daily 90 tablet 0   loratadine (CLARITIN) 10 MG tablet Take 10 mg by mouth daily.     losartan (COZAAR) 50 MG tablet Take 2 tablets by mouth once daily 180 tablet 0   Multiple Vitamins-Minerals (MULTIVITAMIN WITH MINERALS) tablet Take 1 tablet by mouth daily.     No current facility-administered medications on file prior to visit.     ROS see history of present illness  Objective  Physical Exam Vitals:   02/22/22 1102  BP: 118/76  Pulse: 78  Temp: 98.8 F (37.1 C)  SpO2: 96%    BP Readings from Last 3 Encounters:  02/22/22 118/76  05/31/21 116/72  03/20/20 114/80   Wt Readings from Last 3 Encounters:  02/22/22 222 lb (100.7 kg)  05/31/21 223 lb 6.4 oz (101.3 kg)  03/20/20 218 lb 12.8 oz (99.2 kg)    Physical Exam Constitutional:      General: She is not in acute distress.    Appearance: Normal appearance.  HENT:     Head: Normocephalic.  Cardiovascular:     Rate and Rhythm: Normal rate and regular rhythm.     Heart sounds:  Normal heart sounds.  Pulmonary:     Effort: Pulmonary effort is normal.     Breath sounds: Normal breath sounds.  Musculoskeletal:        General: No swelling, tenderness, deformity or signs of injury. Normal range of motion.  Skin:    General: Skin is warm and dry.  Neurological:     Mental Status: She is alert.     Cranial Nerves: No cranial nerve deficit.     Sensory: No sensory deficit.     Motor: No weakness.  Psychiatric:        Mood and Affect: Mood normal.        Behavior: Behavior normal.    Assessment/Plan: Please see individual problem list.  Bilateral carpal tunnel syndrome Assessment & Plan: Patient symptoms consistent with bilateral CTS. Will refer to Ortho. Encouraged patient to continue wearing wrist splints. NSAID added PRN.  Orders: -     Ambulatory referral to Orthopedic Surgery -     Ibuprofen; Take 1 tablet (800 mg total) by mouth every 8 (eight) hours as needed.  Dispense: 30 tablet; Refill: 2   Return if symptoms worsen or fail to improve.   Tomasita Morrow, NP-C [place]

## 2022-02-22 NOTE — Assessment & Plan Note (Addendum)
Patient symptoms consistent with bilateral CTS. Will refer to Ortho. Encouraged patient to continue wearing wrist splints. NSAID added PRN.

## 2022-05-10 ENCOUNTER — Other Ambulatory Visit: Payer: Self-pay | Admitting: Nurse Practitioner

## 2022-05-10 DIAGNOSIS — G5603 Carpal tunnel syndrome, bilateral upper limbs: Secondary | ICD-10-CM

## 2022-05-18 ENCOUNTER — Other Ambulatory Visit: Payer: Self-pay | Admitting: Internal Medicine

## 2022-07-29 ENCOUNTER — Ambulatory Visit: Payer: BC Managed Care – PPO | Admitting: Nurse Practitioner

## 2022-08-02 ENCOUNTER — Ambulatory Visit: Payer: BC Managed Care – PPO | Admitting: Nurse Practitioner

## 2022-08-02 VITALS — BP 124/76 | HR 92 | Temp 98.0°F | Ht 63.0 in | Wt 228.2 lb

## 2022-08-02 DIAGNOSIS — K469 Unspecified abdominal hernia without obstruction or gangrene: Secondary | ICD-10-CM

## 2022-08-02 NOTE — Progress Notes (Signed)
Established Patient Office Visit  Subjective:  Patient ID: Victoria Hardin, female    DOB: [birth year]  Age: 57 y.o. MRN: [id number]  CC:  Chief Complaint  Patient presents with   Knot on the abdomen    HPI  [name] presents for knot on the right abdomen. She noticed it last year.  Denies any abdominal pain, HPI   Past Medical History:  Diagnosis Date   History of kidney stones    Shingles outbreak     Past Surgical History:  Procedure Laterality Date   ABDOMINAL HYSTERECTOMY     total hysterectomy   BREAST SURGERY     left and right breast biopsies    Family History  Problem Relation Age of Onset   Cancer Mother 35       colon CA   Heart disease Maternal Grandmother        smoker.   Cancer Maternal Grandfather        lung, tobacco abuse    Social History   Socioeconomic History   Marital status: Single    Spouse name: Not on file   Number of children: Not on file   Years of education: Not on file   Highest education level: Some college, no degree  Occupational History   Not on file  Tobacco Use   Smoking status: Never   Smokeless tobacco: Never  Substance and Sexual Activity   Alcohol use: Yes    Alcohol/week: 1.0 standard drink of alcohol    Types: 1 Glasses of wine per week   Drug use: Not on file   Sexual activity: Not on file  Other Topics Concern   Not on file  Social History Narrative   Not on file   Social Determinants of Health   Financial Resource Strain: Low Risk  (08/02/2022)   Overall Financial Resource Strain (CARDIA)    Difficulty of Paying Living Expenses: Not hard at all  Food Insecurity: No Food Insecurity (08/02/2022)   Hunger Vital Sign    Worried About Running Out of Food in the Last Year: Never true    Ran Out of Food in the Last Year: Never true  Transportation Needs: No Transportation Needs (08/02/2022)   PRAPARE - Administrator, Civil Service (Medical): No    Lack of Transportation (Non-Medical): No   Physical Activity: Insufficiently Active (08/02/2022)   Exercise Vital Sign    Days of Exercise per Week: 3 days    Minutes of Exercise per Session: 30 min  Stress: No Stress Concern Present (08/02/2022)   [employer] of Occupational Health - Occupational Stress Questionnaire    Feeling of Stress : Not at all  Social Connections: Moderately Isolated (08/02/2022)   Social Connection and Isolation Panel [NHANES]    Frequency of Communication with Friends and Family: More than three times a week    Frequency of Social Gatherings with Friends and Family: More than three times a week    Attends Religious Services: 1 to 4 times per year    Active Member of [employer] or Organizations: No    Attends Engineer, structural: Not on file    Marital Status: Never married  Intimate Partner Violence: Not on file     Outpatient Medications Prior to Visit  Medication Sig Dispense Refill   Budesonide (PULMICORT FLEXHALER) 90 MCG/ACT inhaler Inhale 1 puff into the lungs 2 (two) times daily. 1 each 11   diphenhydrAMINE (BENADRYL) 25 MG tablet Take 25 mg  by mouth every 6 (six) hours as needed.     ibuprofen (ADVIL) 800 MG tablet TAKE 1 TABLET BY MOUTH EVERY 8 HOURS AS NEEDED 30 tablet 2   loratadine (CLARITIN) 10 MG tablet Take 10 mg by mouth daily.     Multiple Vitamins-Minerals (MULTIVITAMIN WITH MINERALS) tablet Take 1 tablet by mouth daily.     hydrochlorothiazide (HYDRODIURIL) 25 MG tablet Take 1 tablet by mouth once daily 90 tablet 0   losartan (COZAAR) 50 MG tablet Take 2 tablets by mouth once daily 180 tablet 0   No facility-administered medications prior to visit.    No Known Allergies  ROS Review of Systems Negative unless indicated in HPI.    Objective:    Physical Exam Constitutional:      Appearance: Normal appearance.  Cardiovascular:     Rate and Rhythm: Normal rate and regular rhythm.     Pulses: Normal pulses.     Heart sounds: Normal heart sounds.  Pulmonary:      Effort: Pulmonary effort is normal.     Breath sounds: Normal breath sounds.  Abdominal:     General: Bowel sounds are normal.     Palpations: Abdomen is soft.     Tenderness: There is no right CVA tenderness or left CVA tenderness.     Hernia: A hernia (Right upper abdomen) is present.  Musculoskeletal:     Cervical back: Normal range of motion.  Neurological:     General: No focal deficit present.     Mental Status: She is alert. Mental status is at baseline.  Psychiatric:        Mood and Affect: Mood normal.        Behavior: Behavior normal.        Thought Content: Thought content normal.        Judgment: Judgment normal.     BP 124/76   Pulse 92   Temp 98 F (36.7 C) (Oral)   Ht 5\' 3"  (1.6 m)   Wt 228 lb 3.2 oz (103.5 kg)   SpO2 94%   BMI 40.42 kg/m  Wt Readings from Last 3 Encounters:  08/02/22 228 lb 3.2 oz (103.5 kg)  02/22/22 222 lb (100.7 kg)  05/31/21 223 lb 6.4 oz (101.3 kg)     Health Maintenance  Topic Date Due   HIV Screening  Never done   Zoster Vaccines- Shingrix (1 of 2) Never done   MAMMOGRAM  03/21/2020   Colonoscopy  08/19/2021   COVID-19 Vaccine (4 - 2023-24 season) 10/15/2021   PAP SMEAR-Modifier  11/14/2021   INFLUENZA VACCINE  09/15/2022   DTaP/Tdap/Td (2 - Td or Tdap) 01/15/2023   Hepatitis C Screening  Completed   HPV VACCINES  Aged Out    There are no preventive care reminders to display for this patient.  Lab Results  Component Value Date   TSH 1.23 05/31/2021   Lab Results  Component Value Date   WBC 4.3 05/31/2021   HGB 12.4 05/31/2021   HCT 38.0 05/31/2021   MCV 84.8 05/31/2021   PLT 201.0 05/31/2021   Lab Results  Component Value Date   NA 138 05/31/2021   K 3.5 05/31/2021   CO2 31 05/31/2021   GLUCOSE 83 05/31/2021   BUN 13 05/31/2021   CREATININE 0.81 05/31/2021   BILITOT 0.4 05/31/2021   ALKPHOS 87 05/31/2021   AST 14 05/31/2021   ALT 10 05/31/2021   PROT 7.3 05/31/2021   ALBUMIN 4.2 05/31/2021   CALCIUM  9.9 05/31/2021   GFR 80.88 05/31/2021   Lab Results  Component Value Date   CHOL 214 (H) 05/31/2021   Lab Results  Component Value Date   HDL 52.00 05/31/2021   Lab Results  Component Value Date   LDLCALC 140 (H) 05/31/2021   Lab Results  Component Value Date   TRIG 113.0 05/31/2021   Lab Results  Component Value Date   CHOLHDL 4 05/31/2021   Lab Results  Component Value Date   HGBA1C 6.2 05/31/2021      Assessment & Plan:  Hernia of abdominal cavity Assessment & Plan: Nontender, reducible and bulging area to the abdomen. Advised patient to avoid lifting heavy stuff. Handout provided for supplemental information. Discussed referral to surgeon but patient declined at present. Will continue to monitor.     Follow-up: No follow-ups on file.   Kara Dies, NP

## 2022-08-02 NOTE — Patient Instructions (Addendum)
Hernia, Adult     A hernia happens when an organ or tissue inside your body pushes out through a weak spot in the muscles of your belly (abdomen). This makes a bulge. The bulge may be: In a scar from a surgery that was done in your belly (incisional hernia). Near your belly button (umbilical hernia). In your groin (inguinal hernia). Your groin is the area where your leg meets your lower belly. If you are a female, this type could also be in your scrotum. In your upper thigh (femoral hernia). Inside your belly (hiatal hernia). This happens when your stomach slides above the muscle between your belly and your chest (diaphragm). What are the causes? This condition may be caused by: Lifting heavy things. Coughing over a long period of time. Having trouble pooping (constipation). Trouble pooping can lead to straining. A cut from surgery in your belly. A physical problem that is present at birth. Being very overweight. Smoking. Too much fluid in your belly. A testicle that has not moved down into the scrotum, in males. What are the signs or symptoms? The main symptom is a bulge in the area of the hernia, but a bulge may not always be seen. It may grow bigger or be easier to see when you cough or strain (such as when lifting something heavy). A hernia that can be pushed back into the belly rarely causes pain. A hernia that cannot be pushed back into the belly may lose its blood supply. This may cause: Pain. Fever. A feeling like you may vomit, and vomiting. Swelling. Trouble pooping. How is this treated? A hernia that is small and painless may not need to be treated. A hernia that is large or painful may be treated with surgery. Surgery to treat a hernia involves pushing the bulge back into place and repairing the weak area of the muscle or belly. Follow these instructions at home: Activity Avoid straining the muscles near your hernia. This can happen when you: Lift something heavy. Poop  (have a bowel movement). Do not lift anything that is heavier than 10 lb (4.5 kg), or the limit that you are told. When you lift something heavy, use your leg muscles. Do not use your back muscles to lift. Prevent trouble pooping If told by your doctor, take steps to prevent trouble pooping. You may need to: Drink enough fluid to keep your pee (urine) pale yellow. Take medicines. You will be told what medicines to take. Eat foods that are high in fiber. These include beans, whole grains, and fresh fruits and vegetables. Limit foods that are high in fat and sugar. These include fried or sweet foods. General instructions When you cough, try to cough gently. You may try to push your hernia back in by gently pressing on it when you are lying down. Do not try to force the bulge back in if it will not go in easily. If you are overweight, work with your doctor to lose weight safely. Do not smoke or use any products that contain nicotine or tobacco. If you need help quitting, ask your doctor. If you will be having surgery, watch your hernia for changes in shape, size, or color. Tell your doctor if you see any changes. Take over-the-counter and prescription medicines only as told by your doctor. Keep all follow-up visits. Contact a doctor if: You get new pain, swelling, or redness near your hernia. You poop fewer times in a week than normal. You have trouble pooping. You have   poop that is more dry than normal. You have poop that is harder or larger than normal. Get help right away if: You have a fever or chills. You have belly pain that gets worse. You feel like you may vomit, or you vomit. Your hernia cannot be pushed in by gently pressing on it when you are lying down. Your hernia: Changes in shape or size. Changes color. Feels hard, or it hurts when you touch it. These symptoms may be an emergency. Get help right away. Call your local emergency services (911 in the [place] Do not wait to  see if the symptoms will go away. Do not drive yourself to the hospital. Summary A hernia happens when an organ or tissue inside your body pushes out through a weak spot in the belly muscles. This creates a bulge. If your hernia is small and it does not hurt, you may not need treatment. If your hernia is large or it hurts, you may need surgery. If you will be having surgery, watch your hernia for changes in shape, size, or color. Tell your doctor about any changes. This information is not intended to replace advice given to you by your health care provider. Make sure you discuss any questions you have with your health care provider. Document Revised: 09/09/2019 Document Reviewed: 09/09/2019 Elsevier Patient Education  2024 [employer].

## 2022-08-12 ENCOUNTER — Other Ambulatory Visit: Payer: Self-pay | Admitting: Internal Medicine

## 2022-08-12 NOTE — Telephone Encounter (Signed)
LMTCB. Pt is over due for an appt with Dr. Darrick Huntsman. Please schedule when pt returns call.

## 2022-08-13 DIAGNOSIS — K469 Unspecified abdominal hernia without obstruction or gangrene: Secondary | ICD-10-CM | POA: Insufficient documentation

## 2022-08-13 NOTE — Assessment & Plan Note (Addendum)
Nontender, reducible and bulging area to the abdomen. Advised patient to avoid lifting heavy stuff. Handout provided for supplemental information. Discussed referral to surgeon but patient declined at present. Will continue to monitor.

## 2022-09-14 ENCOUNTER — Encounter (INDEPENDENT_AMBULATORY_CARE_PROVIDER_SITE_OTHER): Payer: Self-pay

## 2022-10-13 ENCOUNTER — Encounter: Payer: Self-pay | Admitting: Internal Medicine

## 2022-10-13 ENCOUNTER — Ambulatory Visit: Payer: BC Managed Care – PPO | Admitting: Internal Medicine

## 2022-10-13 VITALS — BP 112/80 | HR 60 | Temp 97.8°F | Ht 63.0 in | Wt 223.0 lb

## 2022-10-13 DIAGNOSIS — E7800 Pure hypercholesterolemia, unspecified: Secondary | ICD-10-CM

## 2022-10-13 DIAGNOSIS — R7303 Prediabetes: Secondary | ICD-10-CM

## 2022-10-13 DIAGNOSIS — Z1211 Encounter for screening for malignant neoplasm of colon: Secondary | ICD-10-CM

## 2022-10-13 DIAGNOSIS — Z1231 Encounter for screening mammogram for malignant neoplasm of breast: Secondary | ICD-10-CM

## 2022-10-13 DIAGNOSIS — E669 Obesity, unspecified: Secondary | ICD-10-CM

## 2022-10-13 DIAGNOSIS — I10 Essential (primary) hypertension: Secondary | ICD-10-CM

## 2022-10-13 DIAGNOSIS — G5603 Carpal tunnel syndrome, bilateral upper limbs: Secondary | ICD-10-CM

## 2022-10-13 DIAGNOSIS — K439 Ventral hernia without obstruction or gangrene: Secondary | ICD-10-CM

## 2022-10-13 DIAGNOSIS — J683 Other acute and subacute respiratory conditions due to chemicals, gases, fumes and vapors: Secondary | ICD-10-CM

## 2022-10-13 LAB — COMPREHENSIVE METABOLIC PANEL
ALT: 10 U/L (ref 0–35)
AST: 17 U/L (ref 0–37)
Albumin: 4 g/dL (ref 3.5–5.2)
Alkaline Phosphatase: 83 U/L (ref 39–117)
BUN: 12 mg/dL (ref 6–23)
CO2: 29 mEq/L (ref 19–32)
Calcium: 10.1 mg/dL (ref 8.4–10.5)
Chloride: 103 mEq/L (ref 96–112)
Creatinine, Ser: 0.87 mg/dL (ref 0.40–1.20)
GFR: 73.52 mL/min (ref >60.00)
Glucose, Bld: 84 mg/dL (ref 70–99)
Potassium: 3.6 mEq/L (ref 3.5–5.1)
Sodium: 139 mEq/L (ref 135–145)
Total Bilirubin: 0.5 mg/dL (ref 0.2–1.2)
Total Protein: 7.4 g/dL (ref 6.0–8.3)

## 2022-10-13 LAB — LDL CHOLESTEROL, DIRECT: Direct LDL: 166 mg/dL

## 2022-10-13 LAB — LIPID PANEL
Cholesterol: 199 mg/dL (ref 0–200)
HDL: 43.5 mg/dL (ref >39.00)
LDL Cholesterol: 134 mg/dL — ABNORMAL HIGH (ref 0–99)
NonHDL: 155.5
Total CHOL/HDL Ratio: 5
Triglycerides: 109 mg/dL (ref 0.0–149.0)
VLDL: 21.8 mg/dL (ref 0.0–40.0)

## 2022-10-13 LAB — HEMOGLOBIN A1C: Hgb A1c MFr Bld: 6.2 % (ref 4.6–6.5)

## 2022-10-13 MED ORDER — IBUPROFEN 800 MG PO TABS
800.0000 mg | ORAL_TABLET | Freq: Three times a day (TID) | ORAL | 2 refills | Status: DC | PRN
Start: 2022-10-13 — End: 2023-02-13

## 2022-10-13 MED ORDER — HYDROCHLOROTHIAZIDE 25 MG PO TABS: 25.0000 mg | ORAL_TABLET | Freq: Every day | ORAL | 1 refills | Status: DC

## 2022-10-13 MED ORDER — LOSARTAN POTASSIUM 50 MG PO TABS: 100.0000 mg | ORAL_TABLET | Freq: Every day | ORAL | 1 refills | Status: DC

## 2022-10-13 NOTE — Patient Instructions (Addendum)
Please check your BP 5 times over the 2 months  and send me the readings .  If you get a stomach bug,  suspend the hydrochlorothiazide until your diarrhea and/or vomiting resolves    For your knee pain:  you have "osteoarthritis"  just keep moving!   It gets worse with inactivity.  You can take  to 2000 mg of acetominophen (tylenol) every day safely  In divided doses (500 mg every 6 hours  Or 1000 mg every 12 hours.)  Add ibuprofen or aleve as needed but avoid daily use of either because they can cause stomach ulcers ,  kidney failure and high blood pressure   Your ventral hernia may get bigger or become "Incarcerated"  which is a surgical emergency.  At some point you should see a general surgeon to discuss fixing his

## 2022-10-13 NOTE — Progress Notes (Signed)
Subjective:  Patient ID: Victoria Hardin, female    DOB: [birth year]  Age: 57 y.o. MRN: [id number]  CC: The primary encounter diagnosis was Primary hypertension. Diagnoses of Pure hypercholesterolemia, Prediabetes, Bilateral carpal tunnel syndrome, Encounter for screening mammogram for malignant neoplasm of breast, Colon cancer screening, Ventral hernia without obstruction or gangrene, Reactive airways dysfunction syndrome (HCC), and Obesity (BMI 30-39.9) were also pertinent to this visit.   HPI [name] presents for  Chief Complaint  Patient presents with   Medical Management of Chronic Issues    1) seen in June by NP Evelene Croon for abdominal mass c/w reducible ventral hernia; continues to defer surgical consult as she is asymptomatic.   2) Obesity: has lost 5 lbs since June using portionn reduction and eliminating refined sugar.   3) HTN: she is  at goal on 50 mg losartan and 25 mg hydrochlorothiazide . Home readings have been < 130/80.  Uses aleve prn   4) prediabetes :  working on diet and losing weight on her own.   Outpatient Medications Prior to Visit  Medication Sig Dispense Refill   Budesonide (PULMICORT FLEXHALER) 90 MCG/ACT inhaler Inhale 1 puff into the lungs 2 (two) times daily. 1 each 11   diphenhydrAMINE (BENADRYL) 25 MG tablet Take 25 mg by mouth every 6 (six) hours as needed.     loratadine (CLARITIN) 10 MG tablet Take 10 mg by mouth daily.     Multiple Vitamins-Minerals (MULTIVITAMIN WITH MINERALS) tablet Take 1 tablet by mouth daily.     hydrochlorothiazide (HYDRODIURIL) 25 MG tablet Take 1 tablet by mouth once daily 60 tablet 0   ibuprofen (ADVIL) 800 MG tablet TAKE 1 TABLET BY MOUTH EVERY 8 HOURS AS NEEDED 30 tablet 2   losartan (COZAAR) 50 MG tablet Take 2 tablets by mouth once daily 120 tablet 0   No facility-administered medications prior to visit.    Review of Systems;  Patient denies headache, fevers, malaise, unintentional weight loss, skin rash, eye  pain, sinus congestion and sinus pain, sore throat, dysphagia,  hemoptysis , cough, dyspnea, wheezing, chest pain, palpitations, orthopnea, edema, abdominal pain, nausea, melena, diarrhea, constipation, flank pain, dysuria, hematuria, urinary  Frequency, nocturia, numbness, tingling, seizures,  Focal weakness, Loss of consciousness,  Tremor, insomnia, depression, anxiety, and suicidal ideation.      Objective:  BP 112/80   Pulse 60   Temp 97.8 F (36.6 C) (Oral)   Ht 5\' 3"  (1.6 m)   Wt 223 lb (101.2 kg)   SpO2 97%   BMI 39.50 kg/m   BP Readings from Last 3 Encounters:  10/13/22 112/80  08/02/22 124/76  02/22/22 118/76    Wt Readings from Last 3 Encounters:  10/13/22 223 lb (101.2 kg)  08/02/22 228 lb 3.2 oz (103.5 kg)  02/22/22 222 lb (100.7 kg)    Physical Exam Vitals reviewed.  Constitutional:      General: She is not in acute distress.    Appearance: She is obese. She is not ill-appearing, toxic-appearing or diaphoretic.  HENT:     Head: Normocephalic.  Eyes:     General: No scleral icterus.       Right eye: No discharge.        Left eye: No discharge.     Conjunctiva/sclera: Conjunctivae normal.  Cardiovascular:     Rate and Rhythm: Normal rate and regular rhythm.     Heart sounds: Normal heart sounds.  Pulmonary:     Effort: Pulmonary effort is normal. No respiratory  distress.     Breath sounds: Normal breath sounds.  Abdominal:     General: A surgical scar is present. Bowel sounds are normal.     Tenderness: There is no abdominal tenderness.     Hernia: A hernia is present. Hernia is present in the ventral area.    Musculoskeletal:        General: Normal range of motion.  Skin:    General: Skin is warm and dry.  Neurological:     General: No focal deficit present.     Mental Status: She is alert and oriented to person, place, and time. Mental status is at baseline.  Psychiatric:        Mood and Affect: Mood normal.        Behavior: Behavior normal.         Thought Content: Thought content normal.        Judgment: Judgment normal.    Lab Results  Component Value Date   HGBA1C 6.2 10/13/2022   HGBA1C 6.2 05/31/2021   HGBA1C 6.1 03/12/2020    Lab Results  Component Value Date   CREATININE 0.87 10/13/2022   CREATININE 0.81 05/31/2021   CREATININE 0.92 03/12/2020    Lab Results  Component Value Date   WBC 4.3 05/31/2021   HGB 12.4 05/31/2021   HCT 38.0 05/31/2021   PLT 201.0 05/31/2021   GLUCOSE 84 10/13/2022   CHOL 199 10/13/2022   TRIG 109.0 10/13/2022   HDL 43.50 10/13/2022   LDLDIRECT 166.0 10/13/2022   LDLCALC 134 (H) 10/13/2022   ALT 10 10/13/2022   AST 17 10/13/2022   NA 139 10/13/2022   K 3.6 10/13/2022   CL 103 10/13/2022   CREATININE 0.87 10/13/2022   BUN 12 10/13/2022   CO2 29 10/13/2022   TSH 1.23 05/31/2021   HGBA1C 6.2 10/13/2022   MICROALBUR <0.7 05/31/2021     Assessment & Plan:  .Primary hypertension Assessment & Plan: Well controlled on current regimen of losartan 50 mg and hctz 25 mg daily Renal function stable, no changes today.  Lab Results  Component Value Date   CREATININE 0.87 10/13/2022   Lab Results  Component Value Date   NA 139 10/13/2022   K 3.6 10/13/2022   CL 103 10/13/2022   CO2 29 10/13/2022     Orders: -     Comprehensive metabolic panel  Pure hypercholesterolemia -     Lipid panel -     LDL cholesterol, direct  Prediabetes Assessment & Plan:  she remains at risk for developing diabetes and  has been  following a  low glycemic index diet and walking or exercise.  Repeat a1c is elevated  Lab Results  Component Value Date   HGBA1C 6.2 10/13/2022   Lab Results  Component Value Date   MICROALBUR <0.7 05/31/2021      Orders: -     Hemoglobin A1c -     Comprehensive metabolic panel  Bilateral carpal tunnel syndrome -     Ibuprofen; Take 1 tablet (800 mg total) by mouth every 8 (eight) hours as needed.  Dispense: 30 tablet; Refill: 2  Encounter for  screening mammogram for malignant neoplasm of breast -     3D Screening Mammogram, Left and Right; Future  Colon cancer screening -     Ambulatory referral to Gastroenterology  Ventral hernia without obstruction or gangrene Assessment & Plan: Occurring in the area of prior trochar placement during hysterectomy  She is not tender in the area  noted and continues to defer surgical consult for now.    Reactive airways dysfunction syndrome (HCC) Assessment & Plan: Continue maintenance therapy with pulmicort    Obesity (BMI 30-39.9) Assessment & Plan: Current diet reviewed in detail.  She has been losing weight    Other orders -     hydroCHLOROthiazide; Take 1 tablet (25 mg total) by mouth daily.  Dispense: 90 tablet; Refill: 1 -     Losartan Potassium; Take 2 tablets (100 mg total) by mouth daily.  Dispense: 180 tablet; Refill: 1     I provided 30 minutes of face-to-face time during this encounter reviewing patient's last visit with me, patient's  most recent visit with NP Evelene Croon,  previous surgical and non surgical procedures, previous  labs and imaging studies, counseling on currently addressed issues,  and post visit ordering to diagnostics and therapeutics .   Follow-up: Return in about 6 months (around 04/14/2023).   Sherlene Shams, MD

## 2022-10-15 NOTE — Assessment & Plan Note (Signed)
Continue maintenance therapy with pulmicort  ?

## 2022-10-15 NOTE — Assessment & Plan Note (Signed)
Well controlled on current regimen of losartan 50 mg and hctz 25 mg daily Renal function stable, no changes today.  Lab Results  Component Value Date   CREATININE 0.87 10/13/2022   Lab Results  Component Value Date   NA 139 10/13/2022   K 3.6 10/13/2022   CL 103 10/13/2022   CO2 29 10/13/2022

## 2022-10-15 NOTE — Assessment & Plan Note (Signed)
Current diet reviewed in detail.  She has been losing weight

## 2022-10-15 NOTE — Assessment & Plan Note (Signed)
she remains at risk for developing diabetes and  has been  following a  low glycemic index diet and walking or exercise.  Repeat a1c is elevated  Lab Results  Component Value Date   HGBA1C 6.2 10/13/2022   Lab Results  Component Value Date   MICROALBUR <0.7 05/31/2021

## 2022-10-15 NOTE — Assessment & Plan Note (Signed)
Occurring in the area of prior trochar placement during hysterectomy  She is not tender in the area noted and continues to defer surgical consult for now.

## 2022-10-24 ENCOUNTER — Encounter: Payer: Self-pay | Admitting: *Deleted

## 2022-11-06 ENCOUNTER — Encounter: Payer: Self-pay | Admitting: Internal Medicine

## 2022-11-08 ENCOUNTER — Encounter: Payer: Self-pay | Admitting: Internal Medicine

## 2023-01-30 ENCOUNTER — Encounter: Payer: Self-pay | Admitting: Internal Medicine

## 2023-02-10 ENCOUNTER — Other Ambulatory Visit: Payer: Self-pay | Admitting: Internal Medicine

## 2023-02-10 DIAGNOSIS — G5603 Carpal tunnel syndrome, bilateral upper limbs: Secondary | ICD-10-CM

## 2023-02-21 LAB — HM MAMMOGRAPHY

## 2023-04-01 ENCOUNTER — Other Ambulatory Visit: Payer: Self-pay | Admitting: Internal Medicine

## 2023-04-01 DIAGNOSIS — G5603 Carpal tunnel syndrome, bilateral upper limbs: Secondary | ICD-10-CM

## 2023-04-14 ENCOUNTER — Ambulatory Visit: Payer: 59 | Admitting: Internal Medicine

## 2023-04-14 ENCOUNTER — Encounter: Payer: Self-pay | Admitting: Internal Medicine

## 2023-04-14 VITALS — BP 128/77 | HR 60 | Temp 97.4°F | Wt 216.0 lb

## 2023-04-14 DIAGNOSIS — N6029 Fibroadenosis of unspecified breast: Secondary | ICD-10-CM | POA: Diagnosis not present

## 2023-04-14 DIAGNOSIS — E7800 Pure hypercholesterolemia, unspecified: Secondary | ICD-10-CM

## 2023-04-14 DIAGNOSIS — R7303 Prediabetes: Secondary | ICD-10-CM

## 2023-04-14 DIAGNOSIS — N644 Mastodynia: Secondary | ICD-10-CM

## 2023-04-14 DIAGNOSIS — R600 Localized edema: Secondary | ICD-10-CM

## 2023-04-14 DIAGNOSIS — Z0000 Encounter for general adult medical examination without abnormal findings: Secondary | ICD-10-CM

## 2023-04-14 DIAGNOSIS — Z800 Family history of malignant neoplasm of digestive organs: Secondary | ICD-10-CM

## 2023-04-14 DIAGNOSIS — I10 Essential (primary) hypertension: Secondary | ICD-10-CM

## 2023-04-14 LAB — COMPREHENSIVE METABOLIC PANEL
ALT: 10 U/L (ref 0–35)
AST: 14 U/L (ref 0–37)
Albumin: 4.1 g/dL (ref 3.5–5.2)
Alkaline Phosphatase: 76 U/L (ref 39–117)
BUN: 14 mg/dL (ref 6–23)
CO2: 30 meq/L (ref 19–32)
Calcium: 9.7 mg/dL (ref 8.4–10.5)
Chloride: 103 meq/L (ref 96–112)
Creatinine, Ser: 0.82 mg/dL (ref 0.40–1.20)
GFR: 78.66 mL/min (ref >60.00)
Glucose, Bld: 89 mg/dL (ref 70–99)
Potassium: 3.5 meq/L (ref 3.5–5.1)
Sodium: 141 meq/L (ref 135–145)
Total Bilirubin: 0.5 mg/dL (ref 0.2–1.2)
Total Protein: 7.5 g/dL (ref 6.0–8.3)

## 2023-04-14 LAB — MICROALBUMIN / CREATININE URINE RATIO
Creatinine,U: 92.4 mg/dL
Microalb Creat Ratio: 7.6 mg/g (ref 0.0–30.0)
Microalb, Ur: <0.7 mg/dL (ref 0.0–1.9)

## 2023-04-14 LAB — LIPID PANEL
Cholesterol: 203 mg/dL — ABNORMAL HIGH (ref 0–200)
HDL: 49.5 mg/dL (ref >39.00)
LDL Cholesterol: 133 mg/dL — ABNORMAL HIGH (ref 0–99)
NonHDL: 153.85
Total CHOL/HDL Ratio: 4
Triglycerides: 106 mg/dL (ref 0.0–149.0)
VLDL: 21.2 mg/dL (ref 0.0–40.0)

## 2023-04-14 LAB — LDL CHOLESTEROL, DIRECT: Direct LDL: 136 mg/dL

## 2023-04-14 LAB — HEMOGLOBIN A1C: Hgb A1c MFr Bld: 6.2 % (ref 4.6–6.5)

## 2023-04-14 MED ORDER — SPIRONOLACTONE 25 MG PO TABS: 25.0000 mg | ORAL_TABLET | Freq: Every day | ORAL | 0 refills | Status: DC

## 2023-04-14 NOTE — Assessment & Plan Note (Signed)
 Occurs monthly during same part of cycle,  with fluid retention noted.  Trial of spironolactone

## 2023-04-14 NOTE — Assessment & Plan Note (Addendum)
 She underwent bilateral excisional biopsies at [facility]  in 2021 that were benign and has been released by surgical oncology to follow up with annual mammograms .  SHE DID NOT HAVE A MAMMOGRAM for 3 years  ,  but had a normal one  at [facility] JAN 2025 . She is concerned about the wording in the report,  which we discussed today,  She reports recurrent breast tenderness on the left for several days per month, and notes fluid retention during that time

## 2023-04-14 NOTE — Assessment & Plan Note (Signed)
 she remains at risk for developing diabetes and  has not  been  following a  low glycemic index diet .  Counselling given.    Repeat a1c is elevated  Lab Results  Component Value Date   HGBA1C 6.2 10/13/2022   Lab Results  Component Value Date   MICROALBUR <0.7 05/31/2021

## 2023-04-14 NOTE — Assessment & Plan Note (Signed)
 Imporved with medication change from amlodipine to losartan and hctz

## 2023-04-14 NOTE — Assessment & Plan Note (Signed)

## 2023-04-14 NOTE — Assessment & Plan Note (Signed)
 Well controlled on current regimen of losartan 100 mg and hctz 25 mg daily Renal function stable, no changes today.  Lab Results  Component Value Date   CREATININE 0.87 10/13/2022   Lab Results  Component Value Date   NA 139 10/13/2022   K 3.6 10/13/2022   CL 103 10/13/2022   CO2 29 10/13/2022

## 2023-04-14 NOTE — Patient Instructions (Addendum)
 You are overdue for your colonoscopy; you have to have one every 5 yrs because of your family history  Please call [facility] to schedule.  If they require a referral, tell them I have  already placed it to the [place] location'  I have prescribed spironolactone to use for fluid retention several days per month.  Is it a diuretic   The new goals for optimal blood pressure management are 120/70 to 130/80 .  Please check your blood pressure a few times at home and send me the readings so I can determine if you need a change in medication

## 2023-04-14 NOTE — Progress Notes (Signed)
 Patient ID: Victoria Hardin, female    DOB: 10-17-1965  Age: 58 y.o. MRN: [id number]  The patient is here for annual preventive examination and management of other chronic and acute problems.   The risk factors are reflected in the social history.   The roster of all physicians providing medical care to patient - is listed in the Snapshot section of the chart.   Activities of daily living:  The patient is 100% independent in all ADLs: dressing, toileting, feeding as well as independent mobility   Home safety : The patient has smoke detectors in the home. They wear seatbelts.  There are no unsecured firearms at home. There is no violence in the home.    There is no risks for hepatitis, STDs or HIV. There is no   history of blood transfusion. They have no travel history to infectious disease endemic areas of the world.   The patient has seen their dentist in the last six month. They have seen their eye doctor in the last year. The patinet  denies slight hearing difficulty with regard to whispered voices and some television programs.  They have deferred audiologic testing in the last year.  They do not  have excessive sun exposure. Discussed the need for sun protection: hats, long sleeves and use of sunscreen if there is significant sun exposure.    Diet: the importance of a healthy diet is discussed.   The benefits of regular aerobic exercise were discussed. The patient  is not exercising     Depression screen: there are no signs or vegative symptoms of depression- irritability, change in appetite, anhedonia, sadness/tearfullness.   The following portions of the patient's history were reviewed and updated as appropriate: allergies, current medications, past family history, past medical history,  past surgical history, past social history  and problem list.   Visual acuity was not assessed per patient preference since the patient has regular follow up with an  ophthalmologist. Hearing and body mass  index were assessed and reviewed.    During the course of the visit the patient was educated and counseled about appropriate screening and preventive services including : fall prevention , diabetes screening, nutrition counseling, colorectal cancer screening, and recommended immunizations.    Chief Complaint:  Obesity:  Losing weight   evening dinners have dropped since working 2 to 7 at [employer] . Comes home too exhausted to cook so has a light snack  ( chicken nuggets and a milkshake!)   HTN:  taking losartan 100 mg and hydrochlorothiazide 25 mg daily.  needs to submit home readings  Left breast tenderness occurs for a few days each  month    Review of Symptoms  Patient denies headache, fevers, malaise, unintentional weight loss, skin rash, eye pain, sinus congestion and sinus pain, sore throat, dysphagia,  hemoptysis , cough, dyspnea, wheezing, chest pain, palpitations, orthopnea, edema, abdominal pain, nausea, melena, diarrhea, constipation, flank pain, dysuria, hematuria, urinary  Frequency, nocturia, numbness, tingling, seizures,  Focal weakness, Loss of consciousness,  Tremor, insomnia, depression, anxiety, and suicidal ideation.    Physical Exam:  BP 128/77   Pulse 60   Temp (!) 97.4 F (36.3 C)   Wt 216 lb (98 kg)   SpO2 95%   BMI 38.26 kg/m    Physical Exam Vitals reviewed.  Constitutional:      General: She is not in acute distress.    Appearance: Normal appearance. She is well-developed and normal weight. She is not ill-appearing, toxic-appearing or  diaphoretic.  HENT:     Head: Normocephalic.     Right Ear: Tympanic membrane, ear canal and external ear normal. There is no impacted cerumen.     Left Ear: Tympanic membrane, ear canal and external ear normal. There is no impacted cerumen.     Nose: Nose normal.     Mouth/Throat:     Mouth: Mucous membranes are moist.     Pharynx: Oropharynx is clear.  Eyes:     General: No scleral icterus.       Right eye: No  discharge.        Left eye: No discharge.     Conjunctiva/sclera: Conjunctivae normal.     Pupils: Pupils are equal, round, and reactive to light.  Neck:     Thyroid: No thyromegaly.     Vascular: No carotid bruit or JVD.  Cardiovascular:     Rate and Rhythm: Normal rate and regular rhythm.     Heart sounds: Normal heart sounds.  Pulmonary:     Effort: Pulmonary effort is normal. No respiratory distress.     Breath sounds: Normal breath sounds.  Chest:  Breasts:    Breasts are symmetrical.     Right: Normal. No swelling, inverted nipple, mass, nipple discharge, skin change or tenderness.     Left: Normal. No swelling, inverted nipple, mass, nipple discharge, skin change or tenderness.  Abdominal:     General: Bowel sounds are normal.     Palpations: Abdomen is soft. There is no mass.     Tenderness: There is no abdominal tenderness. There is no guarding or rebound.  Musculoskeletal:        General: Normal range of motion.     Cervical back: Normal range of motion and neck supple.  Lymphadenopathy:     Cervical: No cervical adenopathy.     Upper Body:     Right upper body: No supraclavicular, axillary or pectoral adenopathy.     Left upper body: No supraclavicular, axillary or pectoral adenopathy.  Skin:    General: Skin is warm and dry.  Neurological:     General: No focal deficit present.     Mental Status: She is alert and oriented to person, place, and time. Mental status is at baseline.  Psychiatric:        Mood and Affect: Mood normal.        Behavior: Behavior normal.        Thought Content: Thought content normal.        Judgment: Judgment normal.   Assessment and Plan: Sclerosing adenosis of breast, unspecified laterality Assessment & Plan: She underwent bilateral excisional biopsies at [facility]  in 2021 that were benign and has been released by surgical oncology to follow up with annual mammograms .  SHE DID NOT HAVE A MAMMOGRAM for 3 years  ,  but had a normal one  at  [facility] JAN 2025 . She is concerned about the wording in the report,  which we discussed today,  She reports recurrent breast tenderness on the left for several days per month, and notes fluid retention during that time    Family history of colon cancer requiring screening colonoscopy -     Ambulatory referral to Gastroenterology  Primary hypertension Assessment & Plan: Well controlled on current regimen of losartan 100 mg and hctz 25 mg daily Renal function stable, no changes today.  Lab Results  Component Value Date   CREATININE 0.87 10/13/2022   Lab Results  Component Value  Date   NA 139 10/13/2022   K 3.6 10/13/2022   CL 103 10/13/2022   CO2 29 10/13/2022     Orders: -     Microalbumin / creatinine urine ratio -     Comprehensive metabolic panel  Pure hypercholesterolemia -     Lipid panel -     LDL cholesterol, direct  Prediabetes Assessment & Plan:  she remains at risk for developing diabetes and  has not  been  following a  low glycemic index diet .  Counselling given.    Repeat a1c is elevated  Lab Results  Component Value Date   HGBA1C 6.2 10/13/2022   Lab Results  Component Value Date   MICROALBUR <0.7 05/31/2021      Orders: -     Hemoglobin A1c  Breast tenderness in female Assessment & Plan: Occurs monthly during same part of cycle,  with fluid retention noted.  Trial of spironolactone    Localized edema Assessment & Plan: Imporved with medication change from amlodipine to losartan and hctz    Encounter for preventive health examination Assessment & Plan: age appropriate education and counseling updated, referrals for preventative services and immunizations addressed, dietary and smoking counseling addressed, most recent labs reviewed.  I have personally reviewed and have noted:   1) the patient's medical and social history 2) The pt's use of alcohol, tobacco, and illicit drugs 3) The patient's current medications and supplements 4) Functional  ability including ADL's, fall risk, home safety risk, hearing and visual impairment 5) Diet and physical activities 6) Evidence for depression or mood disorder 7) The patient's height, weight, and BMI have been recorded in the chart.     I have made referrals, and provided counseling and education based on review of the above    Other orders -     Spironolactone; Take 1 tablet (25 mg total) by mouth daily. As needed for fluid gain /breast tenderness  Dispense: 30 tablet; Refill: 0    No follow-ups on file.  Sherlene Shams, MD

## 2023-04-16 ENCOUNTER — Encounter: Payer: Self-pay | Admitting: Internal Medicine

## 2023-05-16 ENCOUNTER — Encounter: Payer: Self-pay | Admitting: Internal Medicine

## 2023-05-16 NOTE — Telephone Encounter (Signed)
 Forwarding so you can see the bp readings. Pt is aware that she needs to be evaluated for the symptoms that she is having.

## 2023-05-18 NOTE — Telephone Encounter (Signed)
 Patient has been scheduled for Friday at 5 pm. Patient was notified via MyChart.

## 2023-05-19 ENCOUNTER — Telehealth: Admitting: Internal Medicine

## 2023-05-19 DIAGNOSIS — J301 Allergic rhinitis due to pollen: Secondary | ICD-10-CM

## 2023-05-19 MED ORDER — PREDNISONE 10 MG PO TABS: ORAL_TABLET | ORAL | 0 refills | Status: DC

## 2023-05-19 MED ORDER — FLUTICASONE PROPIONATE 50 MCG/ACT NA SUSP: 2.0000 | Freq: Every day | NASAL | 6 refills | Status: AC

## 2023-05-19 MED ORDER — AMOXICILLIN-POT CLAVULANATE 875-125 MG PO TABS
1.0000 | ORAL_TABLET | Freq: Two times a day (BID) | ORAL | 0 refills | Status: DC
Start: 2023-05-19 — End: 2023-10-12

## 2023-05-19 NOTE — Patient Instructions (Addendum)
 Your symptoms of congestion are due to the pollen.  The congestion will lead to a sinus infection if you don't get it under control.     You can continue zyrtec D twice daily as long as it doesn't elevate your Blood pressure .. if it does,  go back to using Claritin  You can use Use AFRIN (oxymetazolone) nasal spray for the congestion;  it will not raise your BP.  it is available OTC but should not be used for more than 5 days in a row,  so use it 2 times daily  for 3 days,  then once daily for 2 days then STOP using it . Rebound congestion will occur with use longer than 5 days   I am prescribing a Prednisone taper  to start tomorrow : Take 6 tablets all at once tomorrow morning ,  Then taper by 1 tablet daily until gone  I am also prescribing Flonase nasal spray (steroid)  to help control your allergies . It can be used twice daily for the entire season if the zyrtec/claritin alone is not helping  Do not start the augmentin unless you develop facial pain,  ear pain,  temp > 100.4 ,  or green nasal discharge  Flush your sinuses with Ocean or [place] saline nasal spray after you have been working outside.

## 2023-05-19 NOTE — Progress Notes (Signed)
 Virtual Visit via Caregility   Note   This format is felt to be most appropriate for this patient at this time.  All issues noted in this document were discussed and addressed.  No physical exam was performed (except for noted visual exam findings with Video Visits).   I connected with Victoria Hardin on 05/21/23 at  5:00 PM EDT by a video enabled telemedicine application  and verified that I am speaking with the correct person using two identifiers. Location patient: home Location provider: work or home office Persons participating in the virtual visit: patient, provider  I discussed the limitations, risks, security and privacy concerns of performing an evaluation and management service by telephone and the availability of in person appointments. I also discussed with the patient that there may be a patient responsible charge related to this service. The patient expressed understanding and agreed to proceed.  Reason for visit: sinus congestion,  , rhinitis   HPI:  5 day history of sinus congestion, rhinorrhea with clear nasal discharge  and  occasional blood streaked discharge.  No body aches,  sore throat or fevers.  Some right frontal sinus pressure without pain .  Has been using claritin and mucinex without change.   ROS: See pertinent positives and negatives per HPI.  Past Medical History:  Diagnosis Date   History of kidney stones    Shingles outbreak     Past Surgical History:  Procedure Laterality Date   ABDOMINAL HYSTERECTOMY     total hysterectomy   BREAST SURGERY     left and right breast biopsies    Family History  Problem Relation Age of Onset   Cancer Mother 75       colon CA   Heart disease Maternal Grandmother        smoker.   Cancer Maternal Grandfather        lung, tobacco abuse    SOCIAL HX:  reports that she has never smoked. She has never used smokeless tobacco. She reports current alcohol use of about 1.0 standard drink of alcohol per week. No history on file  for drug use.    Current Outpatient Medications:    amoxicillin-clavulanate (AUGMENTIN) 875-125 MG tablet, Take 1 tablet by mouth 2 (two) times daily., Disp: 14 tablet, Rfl: 0   Budesonide (PULMICORT FLEXHALER) 90 MCG/ACT inhaler, Inhale 1 puff into the lungs 2 (two) times daily., Disp: 1 each, Rfl: 11   diphenhydrAMINE (BENADRYL) 25 MG tablet, Take 25 mg by mouth every 6 (six) hours as needed., Disp: , Rfl:    fluticasone (FLONASE) 50 MCG/ACT nasal spray, Place 2 sprays into both nostrils daily., Disp: 16 g, Rfl: 6   hydrochlorothiazide (HYDRODIURIL) 25 MG tablet, Take 1 tablet by mouth once daily, Disp: 90 tablet, Rfl: 0   ibuprofen (ADVIL) 800 MG tablet, TAKE 1 TABLET BY MOUTH EVERY 8 HOURS AS NEEDED, Disp: 30 tablet, Rfl: 0   loratadine (CLARITIN) 10 MG tablet, Take 10 mg by mouth daily., Disp: , Rfl:    losartan (COZAAR) 50 MG tablet, Take 2 tablets by mouth once daily, Disp: 180 tablet, Rfl: 0   Multiple Vitamins-Minerals (MULTIVITAMIN WITH MINERALS) tablet, Take 1 tablet by mouth daily., Disp: , Rfl:    predniSONE (DELTASONE) 10 MG tablet, 6 tablets on Day 1 , then reduce by 1 tablet daily until gone, Disp: 21 tablet, Rfl: 0   spironolactone (ALDACTONE) 25 MG tablet, Take 1 tablet (25 mg total) by mouth daily. As needed for fluid gain Hendricks Milo  tenderness (Patient taking differently: Take 25 mg by mouth as needed. As needed for fluid gain /breast tenderness), Disp: 30 tablet, Rfl: 0  EXAM:  VITALS per patient if applicable:  GENERAL: alert, oriented, appears well and in no acute distress  HEENT: atraumatic, conjunttiva clear, no obvious abnormalities on inspection of external nose and ears  NECK: normal movements of the head and neck  LUNGS: on inspection no signs of respiratory distress, breathing rate appears normal, no obvious gross SOB, gasping or wheezing  CV: no obvious cyanosis  MS: moves all visible extremities without noticeable abnormality  PSYCH/NEURO: pleasant and  cooperative, no obvious depression or anxiety, speech and thought processing grossly intact  ASSESSMENT AND PLAN: Seasonal allergic rhinitis due to pollen Assessment & Plan: Advised to start prednisone and short supply of Afrin  along with oral antihistamines.  Adding abx if symptoms do not resolve in 3 days    Other orders -     predniSONE; 6 tablets on Day 1 , then reduce by 1 tablet daily until gone  Dispense: 21 tablet; Refill: 0 -     Fluticasone Propionate; Place 2 sprays into both nostrils daily.  Dispense: 16 g; Refill: 6 -     Amoxicillin-Pot Clavulanate; Take 1 tablet by mouth 2 (two) times daily.  Dispense: 14 tablet; Refill: 0      I discussed the assessment and treatment plan with the patient. The patient was provided an opportunity to ask questions and all were answered. The patient agreed with the plan and demonstrated an understanding of the instructions.   The patient was advised to call back or seek an in-person evaluation if the symptoms worsen or if the condition fails to improve as anticipated.   I spent 20 minutes dedicated to the care of this patient on the date of this encounter to include   Face-to-face time with the patient  and post visit ordering of testing and therapeutics.    Sherlene Shams, MD

## 2023-05-21 DIAGNOSIS — J301 Allergic rhinitis due to pollen: Secondary | ICD-10-CM | POA: Insufficient documentation

## 2023-05-21 NOTE — Assessment & Plan Note (Signed)
 Advised to start prednisone and short supply of Afrin  along with oral antihistamines.  Adding abx if symptoms do not resolve in 3 days

## 2023-05-25 ENCOUNTER — Other Ambulatory Visit: Payer: Self-pay | Admitting: Internal Medicine

## 2023-05-25 DIAGNOSIS — G5603 Carpal tunnel syndrome, bilateral upper limbs: Secondary | ICD-10-CM

## 2023-07-15 ENCOUNTER — Other Ambulatory Visit: Payer: Self-pay | Admitting: Internal Medicine

## 2023-10-12 ENCOUNTER — Ambulatory Visit: Payer: 59 | Admitting: Internal Medicine

## 2023-10-12 ENCOUNTER — Encounter: Payer: Self-pay | Admitting: Internal Medicine

## 2023-10-12 VITALS — BP 120/78 | HR 68 | Ht 63.0 in | Wt 216.4 lb

## 2023-10-12 DIAGNOSIS — Z1211 Encounter for screening for malignant neoplasm of colon: Secondary | ICD-10-CM

## 2023-10-12 DIAGNOSIS — I10 Essential (primary) hypertension: Secondary | ICD-10-CM | POA: Diagnosis not present

## 2023-10-12 DIAGNOSIS — E669 Obesity, unspecified: Secondary | ICD-10-CM | POA: Diagnosis not present

## 2023-10-12 DIAGNOSIS — R7303 Prediabetes: Secondary | ICD-10-CM | POA: Diagnosis not present

## 2023-10-12 DIAGNOSIS — Z1231 Encounter for screening mammogram for malignant neoplasm of breast: Secondary | ICD-10-CM

## 2023-10-12 DIAGNOSIS — J683 Other acute and subacute respiratory conditions due to chemicals, gases, fumes and vapors: Secondary | ICD-10-CM

## 2023-10-12 DIAGNOSIS — N6029 Fibroadenosis of unspecified breast: Secondary | ICD-10-CM

## 2023-10-12 LAB — COMPREHENSIVE METABOLIC PANEL WITH GFR
ALT: 10 U/L (ref 0–35)
AST: 13 U/L (ref 0–37)
Albumin: 3.9 g/dL (ref 3.5–5.2)
Alkaline Phosphatase: 81 U/L (ref 39–117)
BUN: 12 mg/dL (ref 6–23)
CO2: 31 meq/L (ref 19–32)
Calcium: 9.3 mg/dL (ref 8.4–10.5)
Chloride: 102 meq/L (ref 96–112)
Creatinine, Ser: 0.76 mg/dL (ref 0.40–1.20)
GFR: 85.87 mL/min (ref >60.00)
Glucose, Bld: 81 mg/dL (ref 70–99)
Potassium: 3.4 meq/L — ABNORMAL LOW (ref 3.5–5.1)
Sodium: 141 meq/L (ref 135–145)
Total Bilirubin: 0.3 mg/dL (ref 0.2–1.2)
Total Protein: 7.2 g/dL (ref 6.0–8.3)

## 2023-10-12 LAB — HEMOGLOBIN A1C: Hgb A1c MFr Bld: 6.4 % (ref 4.6–6.5)

## 2023-10-12 NOTE — Progress Notes (Signed)
 Subjective:  Patient ID: Victoria Hardin, female    DOB: [birth year]  Age: 58 y.o. MRN: [id number]  CC: The primary encounter diagnosis was Primary hypertension. Diagnoses of Colon cancer screening, Prediabetes, Obesity (BMI 30-39.9), Encounter for screening mammogram for malignant neoplasm of breast, Reactive airways dysfunction syndrome (HCC), and Sclerosing adenosis of breast, unspecified laterality were also pertinent to this visit.   HPI Victoria Hardin presents for  Chief Complaint  Patient presents with   Medical Management of Chronic Issues    6 month follow up     1) HTN:  Patient is taking her medications as prescribed ( hydrochlorothiazide  25 mg , losartan  100 mg daily and spironolactone  as needed for fluid retention  )  and notes no adverse effects.  Home BP readings have been done about once per week and are  generally < 130/80 .  She is avoiding added salt in her diet and walking regularly about 3 times per week for exercise  .   2) Caregiver stress:  she  has been living with her elderly father  since 2017 , and he recently fell while she was gone.  He has mild cognitive impairment vs dementia  and woke up confused and started wandering and fell inside the home.   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 Prediabetic by A1c   5) RAD:  she has not had PFTS yet and has been using pulmicort  , fluticasone  and loratidine daily.  She has no symptoms currently   Outpatient Medications Prior to Visit  Medication Sig Dispense Refill   Budesonide  (PULMICORT  FLEXHALER) 90 MCG/ACT inhaler Inhale 1 puff into the lungs 2 (two) times daily. 1 each 11   diphenhydrAMINE (BENADRYL) 25 MG tablet Take 25 mg by mouth every 6 (six) hours as needed.     fluticasone  (FLONASE ) 50 MCG/ACT nasal spray Place 2 sprays into both nostrils daily. 16 g 6   ibuprofen  (ADVIL ) 800  MG tablet TAKE 1 TABLET BY MOUTH EVERY 8 HOURS AS NEEDED 30 tablet 5   loratadine (CLARITIN) 10 MG tablet Take 10 mg by mouth daily.     losartan  (COZAAR ) 50 MG tablet Take 2 tablets by mouth once daily 180 tablet 1   Multiple Vitamins-Minerals (MULTIVITAMIN WITH MINERALS) tablet Take 1 tablet by mouth daily.     hydrochlorothiazide  (HYDRODIURIL ) 25 MG tablet Take 1 tablet by mouth once daily 90 tablet 1   spironolactone  (ALDACTONE ) 25 MG tablet Take 1 tablet (25 mg total) by mouth daily. As needed for fluid gain /breast tenderness (Patient taking differently: Take 25 mg by mouth as needed. As needed for fluid gain /breast tenderness) 30 tablet 0   amoxicillin -clavulanate (AUGMENTIN ) 875-125 MG tablet Take 1 tablet by mouth 2 (two) times daily. 14 tablet 0   predniSONE  (DELTASONE ) 10 MG tablet 6 tablets on Day 1 , then reduce by 1 tablet daily until gone 21 tablet 0   No facility-administered medications prior to visit.    Review of Systems;  Patient denies headache, fevers, malaise, unintentional weight loss, skin rash, eye pain, sinus congestion and sinus pain, sore throat, dysphagia,  hemoptysis , cough, dyspnea, wheezing, chest pain, palpitations, orthopnea, edema, abdominal pain, nausea, melena, diarrhea, constipation, flank pain, dysuria, hematuria, urinary  Frequency, nocturia, numbness, tingling, seizures,  Focal weakness, Loss of consciousness,  Tremor, insomnia, depression, anxiety, and suicidal ideation.      Objective:  BP 120/78   Pulse 68   Ht 5' 3 (1.6 m)   Wt 216 lb 6.4 oz (98.2 kg)   SpO2 99%   BMI 38.33 kg/m   BP Readings from Last 3 Encounters:  10/12/23 120/78  04/14/23 128/77  10/13/22 112/80    Wt Readings from Last 3 Encounters:  10/12/23 216 lb 6.4 oz (98.2 kg)  04/14/23 216 lb (98 kg)  10/13/22 223 lb (101.2 kg)    Physical Exam Vitals reviewed.  Constitutional:      General: She is not in acute distress.    Appearance: Normal appearance. She is  normal weight. She is not ill-appearing, toxic-appearing or diaphoretic.  HENT:     Head: Normocephalic.  Eyes:     General: No scleral icterus.       Right eye: No discharge.        Left eye: No discharge.     Conjunctiva/sclera: Conjunctivae normal.  Cardiovascular:     Rate and Rhythm: Normal rate and regular rhythm.     Heart sounds: Normal heart sounds.  Pulmonary:     Effort: Pulmonary effort is normal. No respiratory distress.     Breath sounds: Normal breath sounds.  Musculoskeletal:        General: Normal range of motion.  Skin:    General: Skin is warm and dry.  Neurological:     General: No focal deficit present.     Mental Status: She is alert and oriented to person, place, and time. Mental status is at baseline.  Psychiatric:        Mood and Affect: Mood normal.        Behavior: Behavior normal.        Thought Content: Thought content normal.        Judgment: Judgment normal.     Lab Results  Component Value Date   HGBA1C 6.4 10/12/2023   HGBA1C 6.2 04/14/2023   HGBA1C 6.2 10/13/2022    Lab Results  Component Value Date   CREATININE 0.76 10/12/2023   CREATININE 0.82 04/14/2023   CREATININE 0.87 10/13/2022    Lab Results  Component Value Date   WBC 4.3 05/31/2021   HGB 12.4 05/31/2021   HCT 38.0 05/31/2021   PLT 201.0 05/31/2021   GLUCOSE 81 10/12/2023   CHOL 203 (H) 04/14/2023   TRIG 106.0 04/14/2023   HDL 49.50 04/14/2023   LDLDIRECT 136.0 04/14/2023   LDLCALC 133 (H) 04/14/2023   ALT 10 10/12/2023   AST 13 10/12/2023   NA 141 10/12/2023   K 3.4 (L) 10/12/2023   CL 102 10/12/2023   CREATININE 0.76 10/12/2023   BUN 12 10/12/2023   CO2 31 10/12/2023   TSH 1.23 05/31/2021   HGBA1C 6.4 10/12/2023   MICROALBUR <0.7 04/14/2023    MM DIAG BREAST TOMO BILATERAL Result Date: 03/22/2019 CLINICAL DATA:  58 year old female with history bilateral breast stereotactic biopsies in December of 2019. Pathology results indicated complex sclerosing  lesions bilaterally. EXAM: DIGITAL DIAGNOSTIC BILATERAL MAMMOGRAM WITH CAD AND TOMO COMPARISON:  Previous exam(s). ACR Breast Density Category c: The breast tissue is heterogeneously dense, which may obscure small masses. FINDINGS: The distortion in the right breast which is about 1.5 cm lateral to the coil biopsy marking clip is stable in appearance. No significant interval change is seen at the site of biopsy at the coil shaped biopsy marking clip in the superior central left  breast. No new suspicious calcifications, masses or areas of distortion are seen in the bilateral breasts. Mammographic images were processed with CAD. IMPRESSION: 1. No significant interval change in the appearance of the bilateral previously biopsied areas of distortion (complex sclerosing lesions). No new findings in the bilateral breasts. RECOMMENDATION: Surgical consultation is recommended for the bilateral complex sclerosing lesions. The patient states that she would like a referral to [facility], which will need to be arranged by her physician. I have sent a epic message to Dr. Tyran Huser on 03/22/19 at 305pm to assist in arranging the referral. I have discussed the findings and recommendations with the patient. If applicable, a reminder letter will be sent to the patient regarding the next appointment. BI-RADS CATEGORY  4: Suspicious. Electronically Signed   By: Rosaline Collet M.D.   On: 03/22/2019 15:13    Assessment & Plan:  .Primary hypertension Assessment & Plan: With recurrent hypokalemia.  Dc hydrochlorothiazide ,  adding spironolactone  50 mg daily   Orders: -     Comprehensive metabolic panel with GFR -     Basic metabolic panel with GFR; Future  Colon cancer screening -     Ambulatory referral to Gastroenterology  Prediabetes Assessment & Plan:  she remains at risk for developing diabetes and  has not  been  following a  low glycemic index diet .  Counselling given.    Repeat a1c is nearly diagnostic   Lab Results   Component Value Date   HGBA1C 6.4 10/12/2023   Lab Results  Component Value Date   MICROALBUR <0.7 04/14/2023      Orders: -     Hemoglobin A1c -     Hemoglobin A1c; Future  Obesity (BMI 30-39.9) Assessment & Plan: Current diet reviewed in detail.  She has not had any net weight loss since February     Encounter for screening mammogram for malignant neoplasm of breast -     Digital Screening Mammogram, Left and Right; Future  Reactive airways dysfunction syndrome (HCC) Assessment & Plan: Advised to continue maintenance therapy with pulmicort  , fluticasone  and claritin. She has not had any recent exacerbations , has not had PFTs but does not want to pursue at this time    Sclerosing adenosis of breast, unspecified laterality Assessment & Plan: She underwent bilateral excisional biopsies at [facility]  in 2021 that were benign and has been released by surgical oncology to follow up with annual mammograms .  SHE DID NOT HAVE A MAMMOGRAM for 3 years  ,  but had a normal one  at [facility] JAN 2025 .    Other orders -     Spironolactone ; Take 1 tablet (50 mg total) by mouth daily.  Dispense: 90 tablet; Refill: 1    Follow-up: Return in about 6 months (around 04/13/2024).   Verneita LITTIE Kettering, MD

## 2023-10-12 NOTE — Progress Notes (Signed)
 a

## 2023-10-12 NOTE — Assessment & Plan Note (Signed)
 Current diet reviewed in detail.  She has not had any net weight loss since February

## 2023-10-12 NOTE — Patient Instructions (Signed)
 Let your Dad's doctor know that he fell ; his fall will make him elegible for home PT and home  heatlh nursing    You can ADD 3000 mg of acetominophen (tylenol) every day safely  In divided doses (750 mg  every 6 hours  Or 1000 mg every 8 hours.)  to your ibuprofen  if needed.  Tylenol will NOT raise your blood pressure,  ibuprofen  WILL    Your blood pressure should be 130/80 or less ON THE MAJORITY OF THE DAYS YOU CHECK IT.    Please check your blood pressure  ONCE A WEEK at home and send me the readings  in one month so I can determine if you need to start an additional  medication to lower your blood pressure . Please make a note next to the reading if you have taken ibuprofen  in the previous 24 hours

## 2023-10-14 ENCOUNTER — Ambulatory Visit: Payer: Self-pay | Admitting: Internal Medicine

## 2023-10-14 MED ORDER — SPIRONOLACTONE 50 MG PO TABS: 50.0000 mg | ORAL_TABLET | Freq: Every day | ORAL | 1 refills | Status: AC

## 2023-10-14 NOTE — Assessment & Plan Note (Signed)
 she remains at risk for developing diabetes and  has not  been  following a  low glycemic index diet .  Counselling given.    Repeat a1c is nearly diagnostic   Lab Results  Component Value Date   HGBA1C 6.4 10/12/2023   Lab Results  Component Value Date   MICROALBUR <0.7 04/14/2023

## 2023-10-14 NOTE — Assessment & Plan Note (Signed)
 With recurrent hypokalemia.  Dc hydrochlorothiazide ,  adding spironolactone  50 mg daily

## 2023-10-14 NOTE — Assessment & Plan Note (Addendum)
 Advised to continue maintenance therapy with pulmicort  , fluticasone  and claritin. She has not had any recent exacerbations , has not had PFTs but does not want to pursue at this time

## 2023-10-14 NOTE — Assessment & Plan Note (Signed)
 She underwent bilateral excisional biopsies at [facility]  in 2021 that were benign and has been released by surgical oncology to follow up with annual mammograms .  SHE DID NOT HAVE A MAMMOGRAM for 3 years  ,  but had a normal one  at [facility] J[facility] 2025 .

## 2023-10-15 NOTE — Telephone Encounter (Signed)
 Last read by Jackson Pepper at 1:20PM on 10/15/2023.

## 2024-01-14 ENCOUNTER — Other Ambulatory Visit: Payer: Self-pay | Admitting: Internal Medicine

## 2024-01-15 ENCOUNTER — Other Ambulatory Visit

## 2024-03-14 ENCOUNTER — Other Ambulatory Visit: Payer: Self-pay | Admitting: Internal Medicine

## 2024-03-14 DIAGNOSIS — G5603 Carpal tunnel syndrome, bilateral upper limbs: Secondary | ICD-10-CM

## 2024-03-15 ENCOUNTER — Other Ambulatory Visit: Payer: Self-pay | Admitting: Internal Medicine

## 2024-03-15 DIAGNOSIS — G5603 Carpal tunnel syndrome, bilateral upper limbs: Secondary | ICD-10-CM

## 2024-03-15 MED ORDER — IBUPROFEN 800 MG PO TABS: 800.0000 mg | ORAL_TABLET | Freq: Three times a day (TID) | ORAL | 5 refills | Status: AC | PRN

## 2024-04-17 ENCOUNTER — Ambulatory Visit: Admitting: Internal Medicine
